# Patient Record
Sex: Male | Born: 1990
Health system: Southern US, Community
[De-identification: ages and names within clinical notes are randomized; demographics above are authoritative.]

## PROBLEM LIST (undated history)

## (undated) DIAGNOSIS — R600 Localized edema: Secondary | ICD-10-CM

## (undated) DIAGNOSIS — F419 Anxiety disorder, unspecified: Secondary | ICD-10-CM

## (undated) DIAGNOSIS — G4733 Obstructive sleep apnea (adult) (pediatric): Secondary | ICD-10-CM

## (undated) DIAGNOSIS — K219 Gastro-esophageal reflux disease without esophagitis: Secondary | ICD-10-CM

## (undated) DIAGNOSIS — R519 Headache, unspecified: Secondary | ICD-10-CM

## (undated) DIAGNOSIS — D649 Anemia, unspecified: Secondary | ICD-10-CM

## (undated) DIAGNOSIS — M9250 Juvenile osteochondrosis of tibia and fibula, unspecified leg: Secondary | ICD-10-CM

## (undated) DIAGNOSIS — Z8709 Personal history of other diseases of the respiratory system: Secondary | ICD-10-CM

## (undated) DIAGNOSIS — F329 Major depressive disorder, single episode, unspecified: Secondary | ICD-10-CM

## (undated) DIAGNOSIS — F32A Depression, unspecified: Secondary | ICD-10-CM

## (undated) DIAGNOSIS — Z872 Personal history of diseases of the skin and subcutaneous tissue: Secondary | ICD-10-CM

## (undated) DIAGNOSIS — IMO0001 Reserved for inherently not codable concepts without codable children: Secondary | ICD-10-CM

## (undated) DIAGNOSIS — E119 Type 2 diabetes mellitus without complications: Secondary | ICD-10-CM

## (undated) DIAGNOSIS — R51 Headache: Secondary | ICD-10-CM

## (undated) DIAGNOSIS — M92519 Juvenile osteochondrosis of proximal tibia, unspecified leg: Secondary | ICD-10-CM

## (undated) HISTORY — PX: KNEE SURGERY: SHX244

---
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
## 2013-02-09 ENCOUNTER — Inpatient Hospital Stay (HOSPITAL_COMMUNITY): Payer: Medicaid Other

## 2013-02-09 ENCOUNTER — Inpatient Hospital Stay (HOSPITAL_COMMUNITY)
Admission: EM | Admit: 2013-02-09 | Discharge: 2013-02-15 | DRG: 871 | Disposition: A | Discharge status: Home / Self Care | Payer: Medicaid Other | Attending: Internal Medicine | Admitting: Internal Medicine

## 2013-02-09 ENCOUNTER — Encounter (HOSPITAL_COMMUNITY): Payer: Self-pay

## 2013-02-09 ENCOUNTER — Emergency Department (HOSPITAL_COMMUNITY): Payer: Medicaid Other

## 2013-02-09 DIAGNOSIS — R51 Headache: Secondary | ICD-10-CM

## 2013-02-09 DIAGNOSIS — R509 Fever, unspecified: Secondary | ICD-10-CM

## 2013-02-09 DIAGNOSIS — J9600 Acute respiratory failure, unspecified whether with hypoxia or hypercapnia: Secondary | ICD-10-CM

## 2013-02-09 DIAGNOSIS — F1210 Cannabis abuse, uncomplicated: Secondary | ICD-10-CM | POA: Diagnosis present

## 2013-02-09 DIAGNOSIS — M79604 Pain in right leg: Secondary | ICD-10-CM

## 2013-02-09 DIAGNOSIS — Z91199 Patient's noncompliance with other medical treatment and regimen due to unspecified reason: Secondary | ICD-10-CM

## 2013-02-09 DIAGNOSIS — A419 Sepsis, unspecified organism: Principal | ICD-10-CM

## 2013-02-09 DIAGNOSIS — L03119 Cellulitis of unspecified part of limb: Secondary | ICD-10-CM | POA: Diagnosis present

## 2013-02-09 DIAGNOSIS — E872 Acidosis, unspecified: Secondary | ICD-10-CM | POA: Diagnosis present

## 2013-02-09 DIAGNOSIS — I498 Other specified cardiac arrhythmias: Secondary | ICD-10-CM | POA: Diagnosis present

## 2013-02-09 DIAGNOSIS — G4733 Obstructive sleep apnea (adult) (pediatric): Secondary | ICD-10-CM | POA: Diagnosis present

## 2013-02-09 DIAGNOSIS — L03116 Cellulitis of left lower limb: Secondary | ICD-10-CM

## 2013-02-09 DIAGNOSIS — M9250 Juvenile osteochondrosis of tibia and fibula, unspecified leg: Secondary | ICD-10-CM

## 2013-02-09 DIAGNOSIS — M928 Other specified juvenile osteochondrosis: Secondary | ICD-10-CM

## 2013-02-09 DIAGNOSIS — L02419 Cutaneous abscess of limb, unspecified: Secondary | ICD-10-CM | POA: Diagnosis present

## 2013-02-09 DIAGNOSIS — R0602 Shortness of breath: Secondary | ICD-10-CM

## 2013-02-09 DIAGNOSIS — Z9119 Patient's noncompliance with other medical treatment and regimen: Secondary | ICD-10-CM

## 2013-02-09 DIAGNOSIS — I872 Venous insufficiency (chronic) (peripheral): Secondary | ICD-10-CM | POA: Diagnosis present

## 2013-02-09 DIAGNOSIS — M92519 Juvenile osteochondrosis of proximal tibia, unspecified leg: Secondary | ICD-10-CM | POA: Diagnosis present

## 2013-02-09 DIAGNOSIS — R6520 Severe sepsis without septic shock: Secondary | ICD-10-CM | POA: Diagnosis present

## 2013-02-09 DIAGNOSIS — E662 Morbid (severe) obesity with alveolar hypoventilation: Secondary | ICD-10-CM

## 2013-02-09 DIAGNOSIS — R519 Headache, unspecified: Secondary | ICD-10-CM

## 2013-02-09 DIAGNOSIS — D72829 Elevated white blood cell count, unspecified: Secondary | ICD-10-CM

## 2013-02-09 DIAGNOSIS — R7309 Other abnormal glucose: Secondary | ICD-10-CM | POA: Diagnosis present

## 2013-02-09 DIAGNOSIS — R1032 Left lower quadrant pain: Secondary | ICD-10-CM

## 2013-02-09 HISTORY — DX: Obstructive sleep apnea (adult) (pediatric): G47.33

## 2013-02-09 HISTORY — DX: Juvenile osteochondrosis of proximal tibia, unspecified leg: M92.519

## 2013-02-09 HISTORY — DX: Juvenile osteochondrosis of tibia and fibula, unspecified leg: M92.50

## 2013-02-09 LAB — COMPREHENSIVE METABOLIC PANEL
ALT: 29 U/L (ref 0–53)
AST: 27 U/L (ref 0–37)
Albumin: 3.5 g/dL (ref 3.5–5.2)
Alkaline Phosphatase: 61 U/L (ref 39–117)
BUN: 7 mg/dL (ref 6–23)
CO2: 26 mEq/L (ref 19–32)
Calcium: 8.7 mg/dL (ref 8.4–10.5)
Chloride: 101 mEq/L (ref 96–112)
Creatinine, Ser: 0.77 mg/dL (ref 0.50–1.35)
GFR calc Af Amer: >90 mL/min (ref >90)
GFR calc non Af Amer: >90 mL/min (ref >90)
Glucose, Bld: 148 mg/dL — ABNORMAL HIGH (ref 70–99)
Potassium: 4.2 mEq/L (ref 3.5–5.1)
Sodium: 137 mEq/L (ref 135–145)
Total Bilirubin: 0.4 mg/dL (ref 0.3–1.2)
Total Protein: 7.8 g/dL (ref 6.0–8.3)

## 2013-02-09 LAB — PRO B NATRIURETIC PEPTIDE: Pro B Natriuretic peptide (BNP): 24.9 pg/mL (ref 0–125)

## 2013-02-09 LAB — POCT I-STAT 3, ART BLOOD GAS (G3+)
Acid-base deficit: 2 mmol/L (ref 0.0–2.0)
Bicarbonate: 22.7 mEq/L (ref 20.0–24.0)
O2 Saturation: 94 %
Patient temperature: 103
TCO2: 24 mmol/L (ref 0–100)
pCO2 arterial: 44 mmHg (ref 35.0–45.0)
pH, Arterial: 7.331 — ABNORMAL LOW (ref 7.350–7.450)
pO2, Arterial: 86 mmHg (ref 80.0–100.0)

## 2013-02-09 LAB — POCT I-STAT TROPONIN I: Troponin i, poc: 0.04 ng/mL (ref 0.00–0.08)

## 2013-02-09 LAB — CBC WITH DIFFERENTIAL/PLATELET
Basophils Absolute: 0 10*3/uL (ref 0.0–0.1)
Basophils Relative: 0 % (ref 0–1)
Eosinophils Absolute: 0 10*3/uL (ref 0.0–0.7)
Eosinophils Relative: 0 % (ref 0–5)
HCT: 46.1 % (ref 39.0–52.0)
Hemoglobin: 15.5 g/dL (ref 13.0–17.0)
Lymphocytes Relative: 6 % — ABNORMAL LOW (ref 12–46)
Lymphs Abs: 0.8 10*3/uL (ref 0.7–4.0)
MCH: 26.4 pg (ref 26.0–34.0)
MCHC: 33.6 g/dL (ref 30.0–36.0)
MCV: 78.5 fL (ref 78.0–100.0)
Monocytes Absolute: 1 10*3/uL (ref 0.1–1.0)
Monocytes Relative: 7 % (ref 3–12)
Neutro Abs: 12.3 10*3/uL — ABNORMAL HIGH (ref 1.7–7.7)
Neutrophils Relative %: 87 % — ABNORMAL HIGH (ref 43–77)
Platelets: 181 10*3/uL (ref 150–400)
RBC: 5.87 MIL/uL — ABNORMAL HIGH (ref 4.22–5.81)
RDW: 15.1 % (ref 11.5–15.5)
WBC: 14.2 10*3/uL — ABNORMAL HIGH (ref 4.0–10.5)

## 2013-02-09 LAB — URINALYSIS, ROUTINE W REFLEX MICROSCOPIC
Bilirubin Urine: NEGATIVE
Glucose, UA: NEGATIVE mg/dL
Hgb urine dipstick: NEGATIVE
Ketones, ur: NEGATIVE mg/dL
Leukocytes, UA: NEGATIVE
Nitrite: NEGATIVE
Protein, ur: NEGATIVE mg/dL
Specific Gravity, Urine: 1.014 (ref 1.005–1.030)
Urobilinogen, UA: 1 mg/dL (ref 0.0–1.0)
pH: 7 (ref 5.0–8.0)

## 2013-02-09 LAB — LIPASE, BLOOD: Lipase: 13 U/L (ref 11–59)

## 2013-02-09 LAB — CG4 I-STAT (LACTIC ACID)
Lactic Acid, Venous: 4.6 mmol/L — ABNORMAL HIGH (ref 0.5–2.2)
Lactic Acid, Venous: 2.62 mmol/L — ABNORMAL HIGH (ref 0.5–2.2)

## 2013-02-09 LAB — PROCALCITONIN: Procalcitonin: 0.64 ng/mL

## 2013-02-09 MED ORDER — IOHEXOL 350 MG/ML SOLN
100.0000 mL | Freq: Once | INTRAVENOUS | Status: AC | PRN
  Administered 2013-02-09: 100 mL via INTRAVENOUS

## 2013-02-09 MED ORDER — SODIUM CHLORIDE 0.9 % IV BOLUS (SEPSIS)
1000.0000 mL | Freq: Once | INTRAVENOUS | Status: AC
  Administered 2013-02-09: 1000 mL via INTRAVENOUS

## 2013-02-09 MED ORDER — ENOXAPARIN SODIUM 120 MG/0.8ML ~~LOC~~ SOLN
110.0000 mg | SUBCUTANEOUS | Status: DC
  Administered 2013-02-10: 110 mg via SUBCUTANEOUS
  Administered 2013-02-10: via SUBCUTANEOUS
  Administered 2013-02-11 – 2013-02-12 (×2): 110 mg via SUBCUTANEOUS
  Filled 2013-02-09 (×5): qty 0.8

## 2013-02-09 MED ORDER — SODIUM CHLORIDE 0.9 % IV SOLN
1000.0000 mL | INTRAVENOUS | Status: DC
  Administered 2013-02-09: 1000 mL via INTRAVENOUS

## 2013-02-09 MED ORDER — DEXTROSE 5 % IV SOLN: 500.0000 mg | INTRAVENOUS | Status: DC

## 2013-02-09 MED ORDER — ACETAMINOPHEN 325 MG PO TABS
325.0000 mg | ORAL_TABLET | ORAL | Status: AC
  Administered 2013-02-09: 325 mg via ORAL
  Filled 2013-02-09: qty 1

## 2013-02-09 MED ORDER — ONDANSETRON HCL 4 MG PO TABS: 4.0000 mg | ORAL_TABLET | Freq: Three times a day (TID) | ORAL | Status: DC | PRN

## 2013-02-09 MED ORDER — ONDANSETRON 4 MG PO TBDP
4.0000 mg | ORAL_TABLET | Freq: Once | ORAL | Status: AC
  Administered 2013-02-09: 4 mg via ORAL
  Filled 2013-02-09: qty 1

## 2013-02-09 MED ORDER — SODIUM CHLORIDE 0.9 % IV SOLN
INTRAVENOUS | Status: AC
  Administered 2013-02-09: 20:00:00 via INTRAVENOUS

## 2013-02-09 MED ORDER — ONDANSETRON HCL 4 MG/2ML IJ SOLN
4.0000 mg | Freq: Four times a day (QID) | INTRAMUSCULAR | Status: DC | PRN
  Administered 2013-02-10: 4 mg via INTRAVENOUS
  Filled 2013-02-09: qty 2

## 2013-02-09 MED ORDER — SODIUM CHLORIDE 0.9 % IV SOLN
1000.0000 mL | Freq: Once | INTRAVENOUS | Status: AC
  Administered 2013-02-09: 1000 mL via INTRAVENOUS

## 2013-02-09 MED ORDER — HYDROCODONE-ACETAMINOPHEN 5-325 MG PO TABS
1.0000 | ORAL_TABLET | ORAL | Status: DC | PRN
  Administered 2013-02-10 (×2): 1 via ORAL
  Administered 2013-02-11: 2 via ORAL
  Administered 2013-02-11 (×2): 1 via ORAL
  Administered 2013-02-12 – 2013-02-13 (×4): 2 via ORAL
  Filled 2013-02-09: qty 2
  Filled 2013-02-09 (×2): qty 1
  Filled 2013-02-09: qty 2
  Filled 2013-02-09 (×2): qty 1
  Filled 2013-02-09: qty 2
  Filled 2013-02-09: qty 1
  Filled 2013-02-09: qty 2
  Filled 2013-02-09: qty 1

## 2013-02-09 MED ORDER — DEXTROSE 5 % IV SOLN
2.0000 g | INTRAVENOUS | Status: DC
  Filled 2013-02-09: qty 2

## 2013-02-09 MED ORDER — DEXTROSE 5 % IV SOLN
1.0000 g | Freq: Once | INTRAVENOUS | Status: AC
  Administered 2013-02-09: 1 g via INTRAVENOUS
  Filled 2013-02-09: qty 10

## 2013-02-09 MED ORDER — DOXYCYCLINE HYCLATE 100 MG IV SOLR
100.0000 mg | Freq: Two times a day (BID) | INTRAVENOUS | Status: DC
  Administered 2013-02-09 – 2013-02-13 (×8): 100 mg via INTRAVENOUS
  Filled 2013-02-09 (×9): qty 100

## 2013-02-09 MED ORDER — ACETAMINOPHEN 325 MG PO TABS
650.0000 mg | ORAL_TABLET | Freq: Four times a day (QID) | ORAL | Status: DC | PRN
  Administered 2013-02-09 – 2013-02-11 (×3): 650 mg via ORAL
  Filled 2013-02-09 (×3): qty 2

## 2013-02-09 MED ORDER — ENOXAPARIN SODIUM 40 MG/0.4ML ~~LOC~~ SOLN: 40.0000 mg | SUBCUTANEOUS | Status: DC

## 2013-02-09 MED ORDER — ONDANSETRON HCL 4 MG PO TABS: 4.0000 mg | ORAL_TABLET | Freq: Four times a day (QID) | ORAL | Status: DC | PRN

## 2013-02-09 MED ORDER — SODIUM CHLORIDE 0.9 % IV SOLN
INTRAVENOUS | Status: DC
  Administered 2013-02-10 (×2): via INTRAVENOUS

## 2013-02-09 MED ORDER — DEXTROSE 5 % IV SOLN
500.0000 mg | Freq: Once | INTRAVENOUS | Status: AC
  Administered 2013-02-09: 500 mg via INTRAVENOUS
  Filled 2013-02-09: qty 500

## 2013-02-09 NOTE — Progress Notes (Signed)
ANTIBIOTIC CONSULT NOTE - INITIAL  Pharmacy Consult for rocephin/azithromycin Indication: Sepsis  No Known Allergies  Patient Measurements: Height: 5\' 9"  (175.3 cm) IBW/kg (Calculated) : 70.7   Vital Signs: Temp: 103 F (39.4 C) (07/03 1443) Temp src: Oral (07/03 1443) BP: 134/77 mmHg (07/03 1545) Pulse Rate: 122 (07/03 1545) Intake/Output from previous day:   Intake/Output from this shift:    Labs: No results found for this basename: WBC, HGB, PLT, LABCREA, CREATININE,  in the last 72 hours CrCl is unknown because no creatinine reading has been taken. No results found for this basename: VANCOTROUGH, VANCOPEAK, VANCORANDOM, GENTTROUGH, GENTPEAK, GENTRANDOM, TOBRATROUGH, TOBRAPEAK, TOBRARND, AMIKACINPEAK, AMIKACINTROU, AMIKACIN,  in the last 72 hours   Microbiology: No results found for this or any previous visit (from the past 720 hour(s)).  Medical History: Past Medical History  Diagnosis Date  . Blount's disease     Medications:  No medications PTA  Assessment: 22 yo male here with SOB/dizziness and Code Sepsis called.  Pharmacy has been asked to dose rocephin and azithromycin. Initial doses for ED administration have been ordered.  02/09/13 rocephin 02/09/13 azithromycin   7/3 blood x2 7/3 urine  Plan:  -Rocephin 2gm IV q24hr and azithromycin 500mg  IV q24hr -Will follow cultures and clinical progress  Harland German, Pharm D 02/09/2013 4:06 PM

## 2013-02-09 NOTE — ED Notes (Signed)
Phlebotomy at bedside.

## 2013-02-09 NOTE — Consult Note (Signed)
PULMONARY  / CRITICAL CARE MEDICINE  Name: Anthony Cox MRN: [id number] DOB: Sep 03, 1990    ADMISSION DATE:  02/09/2013 CONSULTATION DATE:  02/09/2013  REFERRING MD :  Onalee Hua PRIMARY SERVICE: PCCM  CHIEF COMPLAINT:  Leg pain, headache pain  BRIEF PATIENT DESCRIPTION: 22 y/o male with morbid obesity presented to the [facility] ED on 7/3 with fever, headache, and left thigh pain.  PCCM consulted for severe sepsis and increased WOB.  SIGNIFICANT EVENTS / STUDIES:  7/3 CT chest angio>> poor study, no clear PE, motion artifact  LINES / TUBES:   CULTURES: 7/3 blood >> 7/3 urine >> 7/3 RMSF Ab >>  ANTIBIOTICS: 7/3 ceftriaxone >> 7/3 doxycycline >>  HISTORY OF PRESENT ILLNESS:  22 y/o male with morbid obesity presented to the [facility] ED on 7/3 with fever, headache, and left thigh pain.  PCCM consulted for severe sepsis and increased WOB. He noted the pain in his R thigh 1-2 days prior to admission and then developed the fever on 7/3 so they came to the ED.  He notes no recent trauma or fall, and he wonders if the thigh pain is related to swimming at the gym several days ago.  He went fishing 4-5 days prior to admission but did not note any tick bites.  Headache has developed today.  He denies rash or cough.  Despite an increased respiratory rate, he does not feel short of breath.  He says that he is breathing quickly because his head hurts.  He is supposed to use CPAP at home but does not frequently use it.  PAST MEDICAL HISTORY :  Past Medical History  Diagnosis Date  . Blount's disease    Past Surgical History  Procedure Laterality Date  . Knee surgery     Prior to Admission medications   Medication Sig Start Date End Date Taking? Authorizing Provider  ondansetron (ZOFRAN) 4 MG tablet Take 1 tablet (4 mg total) by mouth every 8 (eight) hours as needed for nausea. 02/09/13   Dione Booze, MD   No Known Allergies  FAMILY HISTORY:  No family history on file. SOCIAL HISTORY:  reports that he  has never smoked. He does not have any smokeless tobacco history on file. He reports that  drinks alcohol. He reports that he uses illicit drugs (Marijuana).  REVIEW OF SYSTEMS:   Gen: + fever, chills, weight change, fatigue, night sweats HEENT: Denies blurred vision, double vision, hearing loss, tinnitus, sinus congestion, rhinorrhea, sore throat, neck stiffness, dysphagia PULM: Denies shortness of breath, cough, sputum production, hemoptysis, wheezing CV: Denies chest pain, edema, orthopnea, paroxysmal nocturnal dyspnea, palpitations GI: Denies abdominal pain, nausea, vomiting, diarrhea, hematochezia, melena, constipation, change in bowel habits GU: Denies dysuria, hematuria, polyuria, oliguria, urethral discharge Endocrine: Denies hot or cold intolerance, polyuria, polyphagia or appetite change Derm: Denies rash, dry skin, scaling or peeling skin change Heme: Denies easy bruising, bleeding, bleeding gums Neuro: Denies headache, numbness, weakness, slurred speech, loss of memory or consciousness    SUBJECTIVE:   VITAL SIGNS: Temp:  [102.8 F (39.3 C)-104 F (40 C)] 103.8 F (39.9 C) (07/03 1924) Pulse Rate:  [119-136] 120 (07/03 2015) Resp:  [26-50] 50 (07/03 2015) BP: (116-152)/(37-86) 130/37 mmHg (07/03 2015) SpO2:  [94 %-100 %] 94 % (07/03 2015) Weight:  [226.799 kg (500 lb)] 226.799 kg (500 lb) (07/03 1608) HEMODYNAMICS:   VENTILATOR SETTINGS:   INTAKE / OUTPUT: Intake/Output     07/03 0701 - 07/04 0700   Urine (mL/kg/hr) 525   Total  Output 525   Net -525         PHYSICAL EXAMINATION:  Gen: morbidly obese, tachypnic but speaking in full sentences, no paradoxical breathing HEENT: NCAT, PERRL, EOMi, OP clear, large neck PULM: CTA B CV: RRR, no mgr, no JVD AB: BS+, soft, nontender, no hsm Ext: warm, trace edema, no clubbing, no cyanosis Derm: no rash or skin breakdown; inner R thigh is tender, but not warm, fluctuant, or indurated Neuro: A&Ox4, CN II-XII intact,  strength 5/5 in all 4 extremities   LABS:  Recent Labs Lab 02/09/13 1449 02/09/13 1620 02/09/13 1635 02/09/13 1636 02/09/13 1719 02/09/13 1924  HGB 15.5  --   --   --   --   --   WBC 14.2*  --   --   --   --   --   PLT 181  --   --   --   --   --   NA  --   --   --  137  --   --   K  --   --   --  4.2  --   --   CL  --   --   --  101  --   --   CO2  --   --   --  26  --   --   GLUCOSE  --   --   --  148*  --   --   BUN  --   --   --  7  --   --   CREATININE  --   --   --  0.77  --   --   CALCIUM  --   --   --  8.7  --   --   AST  --   --   --  27  --   --   ALT  --   --   --  29  --   --   ALKPHOS  --   --   --  61  --   --   BILITOT  --   --   --  0.4  --   --   PROT  --   --   --  7.8  --   --   ALBUMIN  --   --   --  3.5  --   --   LATICACIDVEN  --  4.60*  --   --   --  2.62*  PROCALCITON  --   --   --  0.64  --   --   PROBNP  --   --  24.9  --   --   --   PHART  --   --   --   --  7.331*  --   PCO2ART  --   --   --   --  44.0  --   PO2ART  --   --   --   --  86.0  --    No results found for this basename: GLUCAP,  in the last 168 hours  CXR: no clear infiltrate EKG: Sinus tach, no ST wave changes  ASSESSMENT / PLAN:  22 y/o male with morbid obesity, sinus tachycardia, tachypnea fever, R thigh pain and headache of 1-2 days duration.  Overall picture worrisome for sepsis but lactic acid is improving with IVF.  DDx here is broad and no clear unifying diagnosis exists right now.  Viral prodrome  most likely scenario, but would consider DVT given thigh pain, but also cellulitis; consider tick borne illness vs meningitis given fever, headache and recent fishing trip.    PULMONARY A: Obesity hypoventilation syndrome Tachypnea likely due to pain; reassured by normal ABG P:   -BIPAP QHS -repeat ABG -O2 as needed  CARDIOVASCULAR A: Sinus tach due to fever, sepsis;  Lactic acidosis improving P:  -continue IVF as you are doing -tele  RENAL A:  No acute issues P:    -monitor UOP, BMET  GASTROINTESTINAL A:  No acute issues P:     HEMATOLOGIC A:  No acute issues P:    INFECTIOUS A:  Sepsis with broad ddx as outlined above P:   -agree with ceftriaxone and doxycycline -would consult ID if not improved by 7/4 AM -consider IR guided LP -check RMSF serology -would ultrasound R thigh  ENDOCRINE A:  No acute issues P:     NEUROLOGIC A:  No acute issues P:   -APAP prn for headache  TODAY'S SUMMARY: 22 y/o male with sepsis of uncertain etiology improving with IVF; broad ddx.  Tachypnic but comfortable.  Plan BIPAP, repeat ABG, infectious work up as identified above.   Fonnie Jarvis Pulmonary and Critical Care Medicine [facility] Pager: [phone]  02/09/2013, 8:49 PM

## 2013-02-09 NOTE — ED Notes (Signed)
PER EMS: pt was at home laying on bed when he got up to walk into bathroom, got a HA and dizzy, SOB, then went to lay back down but had to throw up, vomited x 3. Complains of left groin/leg pain. Pt states he usually is winded but this time is different. EMS arrival: Sinus tach, 94% RA, put 2L [place] and O2 increased to 97%. BP: 145/80, HR-130.

## 2013-02-09 NOTE — Progress Notes (Signed)
Arms scanned using Prevue to locate vein to access for PIV - unable to locate vein either lower arm adequate for angio so left cephalic vein accessed using [place] to locate and place 20g 1.88" angio - labs drawn from new site - flushed x3 with good blood return noted each time - IV fluids started via site.

## 2013-02-09 NOTE — ED Notes (Signed)
Second IV team member at bedside attempting second IV.

## 2013-02-09 NOTE — H&P (Signed)
PCP:   Johny Blamer, MD   Chief Complaint:  Fever and neck pain  HPI: 22 yo male with h/o osa noncompliant with cpap at home, blounts disease with no recent surgeries, morbid obesity who comes in with high fever that started today with chills.  He is otherwise healthy.  Denies cough, no n/v/diarrhea.  No abd pain.  No cp.  Is very sob more than usual.  Denies any rashes or dysuria.  No le edema or swelling.  He is having left inner thigh/groin pain that is new.  No rash present.  Denies any scrotal swelling or penile discharge.  No recent abx.  He did have a uri several weeks ago.  No recent traveling.  He is also complaining of a headache since yesterday.  Review of Systems:  Positive and negative as per HPI otherwise all other systems are negative  Past Medical History: Past Medical History  Diagnosis Date  . Blount's disease    Past Surgical History  Procedure Laterality Date  . Knee surgery      Medications: Prior to Admission medications   Medication Sig Start Date End Date Taking? Authorizing Provider  ondansetron (ZOFRAN) 4 MG tablet Take 1 tablet (4 mg total) by mouth every 8 (eight) hours as needed for nausea. 02/09/13   Dione Booze, MD    Allergies:  No Known Allergies  Social History:  reports that he has never smoked. He does not have any smokeless tobacco history on file. He reports that  drinks alcohol. He reports that he uses illicit drugs (Marijuana).  Denies any ivda.  Family History: negative  Physical Exam: Filed Vitals:   02/09/13 1915 02/09/13 1924 02/09/13 1930 02/09/13 1945  BP: 148/60  123/86 139/51  Pulse: 123 119 119 120  Temp:  103.8 F (39.9 C)    TempSrc:  Rectal    Resp: 34 30    Height:      Weight:      SpO2: 99% 98% 99% 99%   General appearance: alert, cooperative and moderate distress  Cannot speak in full sentences Head: Normocephalic, without obvious abnormality, atraumatic Eyes: negative Nose: Nares normal. Septum midline.  Mucosa normal. No drainage or sinus tenderness. Neck: no JVD and supple, symmetrical, trachea midline Lungs: clear to auscultation bilaterally Heart: regular rate and rhythm Abdomen: soft, non-tender; bowel sounds normal; no masses,  no organomegaly Male genitalia: normal Extremities: extremities normal, atraumatic, no cyanosis or edema Pulses: 2+ and symmetric Skin: Skin color, texture, turgor normal. No rashes or lesions  Left inner thigh painful to touch, no warmth no rash palpation normal Neurologic: Grossly normal    Labs on Admission:   Recent Labs  02/09/13 1636  NA 137  K 4.2  CL 101  CO2 26  GLUCOSE 148*  BUN 7  CREATININE 0.77  CALCIUM 8.7    Recent Labs  02/09/13 1636  AST 27  ALT 29  ALKPHOS 61  BILITOT 0.4  PROT 7.8  ALBUMIN 3.5    Recent Labs  02/09/13 1636  LIPASE 13    Recent Labs  02/09/13 1449  WBC 14.2*  NEUTROABS 12.3*  HGB 15.5  HCT 46.1  MCV 78.5  PLT 181    Radiological Exams on Admission: Ct Angio Chest Pe W/cm &/or Wo Cm  02/09/2013   *RADIOLOGY REPORT*  Clinical Data: Shortness of breath.  CT ANGIOGRAPHY CHEST  Technique:  Multidetector CT imaging of the chest using the standard protocol during bolus administration of intravenous contrast. Multiplanar  reconstructed images including MIPs were obtained and reviewed to evaluate the vascular anatomy.  Contrast: OMNIPAQUE IOHEXOL 350 MG/ML SOLN  Comparison: Single view of the chest 02/09/2013 at to 1517 hours.  Findings: The study is limited by the patient's size.  There is also respiratory motion.  No obvious pulmonary embolus is identified.  Heart size is normal.  No pleural or pericardial effusion.  No axillary, hilar or mediastinal lymphadenopathy.  The lungs are grossly clear.  The visualized upper abdomen is unremarkable.  No focal bony abnormality is identified.  IMPRESSION: Limited study demonstrating no evidence of pulmonary embolus or acute cardiopulmonary disease.    Original Report Authenticated By: Holley Dexter, M.D.   Dg Chest Port 1 View  02/09/2013   *RADIOLOGY REPORT*  Clinical Data: Headache.  Short of breath.  PORTABLE CHEST - 1 VIEW  Comparison: None.  Findings: Artifact overlies chest.  Heart size is normal. Mediastinal shadows are normal.  Lungs are clear.  No effusions. No bony abnormalities.  IMPRESSION: No active disease   Original Report Authenticated By: Paulina Fusi, M.D.    Assessment/Plan 22 yo male with sepsis of unclear etiology who has underlying osa and morbid obesity  Principal Problem:   Sepsis Active Problems:   Acute respiratory failure   SOB (shortness of breath)   Blount's disease  Have spoken to CCM for further input.  i wonder if he may have a cellulitis to his inner left thigh that may be brewing, will need to recheck this area again tomorrow.  Place on rocephin, and doxy.  Dr Kendrick Fries got further history that he went fishing in the last 3-4 days, ????tick bite.  Ck serologies and cover with doxy.  Will also obtain lle u/s to r/o dvt or absess.  Will ck total cpk levels also for myositis.  Place on bipap in the stepdown unit.  cta is neg PE but of poor quality.  Consider LP in am which will need to be done with IR.  Blood cultures have been obtained.  Full code.  Moyses Pavey A 02/09/2013, 8:12 PM

## 2013-02-09 NOTE — Progress Notes (Signed)
ANTICOAGULATION CONSULT NOTE - Initial Consult  Pharmacy Consult for Lovenox Indication: VTE prophylaxis in morbid obesity  No Known Allergies  Patient Measurements: Height: 5\' 9"  (175.3 cm) Weight: 500 lb (226.799 kg) IBW/kg (Calculated) : 70.7 Heparin Dosing Weight:   Vital Signs: Temp: 103.1 F (39.5 C) (07/03 2153) Temp src: Oral (07/03 2153) BP: 125/54 mmHg (07/03 2030) Pulse Rate: 117 (07/03 2224)  Labs:  Recent Labs  02/09/13 1449 02/09/13 1636  HGB 15.5  --   HCT 46.1  --   PLT 181  --   CREATININE  --  0.77    Estimated Creatinine Clearance: 279.6 ml/min (by C-G formula based on Cr of 0.77).   Medical History: Past Medical History  Diagnosis Date  . Blount's disease     Medications:  No prescriptions prior to admission    Assessment: Left thigh pain, headache, fever, increased respiratory rate. Ddx: sepsis, tick borne illness vs meningitis, cellulitis.  Pharmacy may adjust Lovenox dosing (per comments on the 40mg  dose) which requires significant adjustment in the patient with morbid obesity.   Scr 0.77. Hgb 15.5 and platelets 181.   Goal of Therapy:  DVT prophylaxis Monitor platelets by anticoagulation protocol: Yes   Plan:  Lovenox 110mg  SQ once daily (0.5mg /kg/day) for BMI>30. Likely will require LMWH levels at steady state.  Merilynn Finland, [employer] 02/09/2013,10:32 PM

## 2013-02-09 NOTE — ED Provider Notes (Signed)
22 year old male was brought in because his breathing seemed to be heavier than normal and he was having chills at home. There's been no cough and no dysuria. He was noted to be febrile on arrival here are temperature 100.3 heart rate of 125 and respiratory rate of 40. He is morbidly obese. Lungs are clear heart has regular rate and rhythm. Abdomen is soft and nontender. Workup in emergency Department is negative for signs of infection with clean urine and a normal chest x-ray and normal CT of his chest. Lactic acid was initially elevated to 4.6 and is compared to 2.6 with IV hydration. Procalcitonin level is in an indeterminate range. He has received antibiotics for sepsis of uncertain cause and will need to be admitted.   Date: 02/09/2013  Rate: 122  Rhythm: sinus tachycardia  QRS Axis: normal  Intervals: normal  ST/T Wave abnormalities: normal  Conduction Disutrbances:none  Narrative Interpretation: Sinus tachycardia, otherwise normal ECG. No prior ECG available for comparison.  Old EKG Reviewed: none available  CRITICAL CARE Performed by: QIONG,EXBMW Total critical care time: 50 minutes Critical care time was exclusive of separately billable procedures and treating other patients. Critical care was necessary to treat or prevent imminent or life-threatening deterioration. Critical care was time spent personally by me on the following activities: development of treatment plan with patient and/or surrogate as well as nursing, discussions with consultants, evaluation of patient's response to treatment, examination of patient, obtaining history from patient or surrogate, ordering and performing treatments and interventions, ordering and review of laboratory studies, ordering and review of radiographic studies, pulse oximetry and re-evaluation of patient's condition.   I saw and evaluated the patient, reviewed the resident's note and I agree with the findings and plan.   Dione Booze,  MD 02/09/13 [phone]

## 2013-02-09 NOTE — ED Notes (Signed)
IV team attempting IV start with ultrasound

## 2013-02-09 NOTE — ED Provider Notes (Signed)
History    CSN: [id number] Arrival date & time 02/09/13  1436  First MD Initiated Contact with Patient 02/09/13 1511     Chief Complaint  Patient presents with  . 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 States normal state of health this am. Stood up to walk to bathroom, developed dizziness, dyspnea, mild HA, n/v x 3 and pain in left groin. Denies cough, no hx dvt/pe, no recent surgery or travel, + sick contact - friend w/ viral illness. EMS noted pt to be febrile, tachycardic and tachypneic. Currently denies hip pain. No injury. No hx of asthma.   Patient is a 22 y.o. male presenting with shortness of breath. The history is provided by the patient. No language interpreter was used.  Shortness of Breath Severity:  Severe Onset quality:  Sudden Timing:  Constant Progression:  Worsening Chronicity:  New Associated symptoms: fever   Associated symptoms: no abdominal pain, no chest pain, no cough, no headaches, no rash, no sore throat and no vomiting    Past Medical History  Diagnosis Date  . Blount's disease    Past Surgical History  Procedure Laterality Date  . Knee surgery     No family history on file. History  Substance Use Topics  . Smoking status: Never Smoker   . Smokeless tobacco: Not on file  . Alcohol Use: Yes    Review of Systems  Constitutional: Positive for fever and chills.  HENT: Negative for congestion and sore throat.   Respiratory: Positive for shortness of breath. Negative for cough.   Cardiovascular: Negative for chest pain and leg swelling.  Gastrointestinal: Negative for nausea, vomiting, abdominal pain, diarrhea and constipation.  Genitourinary: Negative for dysuria and frequency.  Musculoskeletal: Positive for arthralgias.  Skin: Negative for color change and rash.  Neurological: Negative for dizziness  and headaches.  Psychiatric/Behavioral: Negative for confusion and agitation.  All other systems reviewed and are negative.    Allergies  Review of patient's allergies indicates no known allergies.  Home Medications   Current Outpatient Rx  Name  Route  Sig  Dispense  Refill  . ondansetron (ZOFRAN) 4 MG tablet   Oral   Take 1 tablet (4 mg total) by mouth every 8 (eight) hours as needed for nausea.   20 tablet   0    BP 148/71  Pulse 125  Temp(Src) 103 F (39.4 C) (Oral)  Ht 5\' 9"  (1.753 m)  SpO2 98% Physical Exam  Constitutional: He is oriented to person, place, and time. He appears well-developed and well-nourished. No distress.  HENT:  Head: Normocephalic and atraumatic.  Eyes: EOM are normal. Pupils are equal, round, and reactive to light.  Neck: Normal range of motion. Neck supple. No Brudzinski's sign noted.  Cardiovascular: Regular rhythm.  Tachycardia present.   Pulmonary/Chest: Tachypnea noted. He is in respiratory distress. He has decreased breath sounds. He has no wheezes.  Abdominal: Soft. He exhibits distension.  Musculoskeletal: Normal range of motion. He exhibits no edema.  Neurological: He is alert and oriented to person, place, and time. GCS eye subscore is 4. GCS verbal subscore is 5. GCS motor subscore is 6.  Skin: Skin is warm and dry.  Psychiatric: He has a normal mood and affect. His behavior is normal.    ED Course  Procedures (including critical care time) Labs Reviewed  CULTURE, BLOOD (ROUTINE X 2)  CULTURE, BLOOD (ROUTINE X  2)  URINE CULTURE  CBC WITH DIFFERENTIAL  PRO B NATRIURETIC PEPTIDE  URINALYSIS, ROUTINE W REFLEX MICROSCOPIC  COMPREHENSIVE METABOLIC PANEL  PROCALCITONIN   Results for orders placed during the hospital encounter of 02/09/13  MRSA PCR SCREENING      Result Value Range   MRSA by PCR NEGATIVE  NEGATIVE  CBC WITH DIFFERENTIAL      Result Value Range   WBC 14.2 (*) 4.0 - 10.5 K/uL   RBC 5.87 (*) 4.22 - 5.81 MIL/uL    Hemoglobin 15.5  13.0 - 17.0 g/dL   HCT 45.4  09.8 - 11.9 %   MCV 78.5  78.0 - 100.0 fL   MCH 26.4  26.0 - 34.0 pg   MCHC 33.6  30.0 - 36.0 g/dL   RDW 14.7  82.9 - 56.2 %   Platelets 181  150 - 400 K/uL   Neutrophils Relative % 87 (*) 43 - 77 %   Neutro Abs 12.3 (*) 1.7 - 7.7 K/uL   Lymphocytes Relative 6 (*) 12 - 46 %   Lymphs Abs 0.8  0.7 - 4.0 K/uL   Monocytes Relative 7  3 - 12 %   Monocytes Absolute 1.0  0.1 - 1.0 K/uL   Eosinophils Relative 0  0 - 5 %   Eosinophils Absolute 0.0  0.0 - 0.7 K/uL   Basophils Relative 0  0 - 1 %   Basophils Absolute 0.0  0.0 - 0.1 K/uL  PRO B NATRIURETIC PEPTIDE      Result Value Range   Pro B Natriuretic peptide (BNP) 24.9  0 - 125 pg/mL  URINALYSIS, ROUTINE W REFLEX MICROSCOPIC      Result Value Range   Color, Urine YELLOW  YELLOW   APPearance CLEAR  CLEAR   Specific Gravity, Urine 1.014  1.005 - 1.030   pH 7.0  5.0 - 8.0   Glucose, UA NEGATIVE  NEGATIVE mg/dL   Hgb urine dipstick NEGATIVE  NEGATIVE   Bilirubin Urine NEGATIVE  NEGATIVE   Ketones, ur NEGATIVE  NEGATIVE mg/dL   Protein, ur NEGATIVE  NEGATIVE mg/dL   Urobilinogen, UA 1.0  0.0 - 1.0 mg/dL   Nitrite NEGATIVE  NEGATIVE   Leukocytes, UA NEGATIVE  NEGATIVE  COMPREHENSIVE METABOLIC PANEL      Result Value Range   Sodium 137  135 - 145 mEq/L   Potassium 4.2  3.5 - 5.1 mEq/L   Chloride 101  96 - 112 mEq/L   CO2 26  19 - 32 mEq/L   Glucose, Bld 148 (*) 70 - 99 mg/dL   BUN 7  6 - 23 mg/dL   Creatinine, Ser 1.30  0.50 - 1.35 mg/dL   Calcium 8.7  8.4 - 86.5 mg/dL   Total Protein 7.8  6.0 - 8.3 g/dL   Albumin 3.5  3.5 - 5.2 g/dL   AST 27  0 - 37 U/L   ALT 29  0 - 53 U/L   Alkaline Phosphatase 61  39 - 117 U/L   Total Bilirubin 0.4  0.3 - 1.2 mg/dL   GFR calc non Af Amer >90  >90 mL/min   GFR calc Af Amer >90  >90 mL/min  PROCALCITONIN      Result Value Range   Procalcitonin 0.64    LIPASE, BLOOD      Result Value Range   Lipase 13  11 - 59 U/L  CK      Result Value  Range   Total CK  554 (*) 7 - 232 U/L  CG4 I-STAT (LACTIC ACID)      Result Value Range   Lactic Acid, Venous 4.60 (*) 0.5 - 2.2 mmol/L  POCT I-STAT TROPONIN I      Result Value Range   Troponin i, poc 0.04  0.00 - 0.08 ng/mL   Comment 3           POCT I-STAT 3, BLOOD GAS (G3+)      Result Value Range   pH, Arterial 7.331 (*) 7.350 - 7.450   pCO2 arterial 44.0  35.0 - 45.0 mmHg   pO2, Arterial 86.0  80.0 - 100.0 mmHg   Bicarbonate 22.7  20.0 - 24.0 mEq/L   TCO2 24  0 - 100 mmol/L   O2 Saturation 94.0     Acid-base deficit 2.0  0.0 - 2.0 mmol/L   Patient temperature 103.0 F     Collection site RADIAL, ALLEN'S TEST ACCEPTABLE     Drawn by RT     Sample type ARTERIAL    CG4 I-STAT (LACTIC ACID)      Result Value Range   Lactic Acid, Venous 2.62 (*) 0.5 - 2.2 mmol/L        DG Hip Complete Left (Final result)  Result time: 02/09/13 21:24:55    Final result by Rad Results In Interface (02/09/13 21:24:55)    Narrative:   *RADIOLOGY REPORT*  Clinical Data: Left hip pain. Short of breath.  LEFT HIP - COMPLETE 2+ VIEW  Comparison: None.  Findings: Excreted contrast filled urinary bladder. The pelvic rings appear intact. Sacrum partially obscured by contrast in the urinary bladder. There is no hip joint space narrowing. No fracture. The panniculus projects over the hips bilaterally. Left hip is located.  IMPRESSION: No acute abnormality.   Original Report Authenticated By: Andreas Newport, M.D.             CT Angio Chest PE W/Cm &/Or Wo Cm (Final result)  Result time: 02/09/13 18:46:15    Final result by Rad Results In Interface (02/09/13 18:46:15)    Narrative:   *RADIOLOGY REPORT*  Clinical Data: Shortness of breath.  CT ANGIOGRAPHY CHEST  Technique: Multidetector CT imaging of the chest using the standard protocol during bolus administration of intravenous contrast. Multiplanar reconstructed images including MIPs were obtained and reviewed to evaluate the  vascular anatomy.  Contrast: OMNIPAQUE IOHEXOL 350 MG/ML SOLN  Comparison: Single view of the chest 02/09/2013 at to 1517 hours.  Findings: The study is limited by the patient's size. There is also respiratory motion. No obvious pulmonary embolus is identified. Heart size is normal. No pleural or pericardial effusion. No axillary, hilar or mediastinal lymphadenopathy. The lungs are grossly clear. The visualized upper abdomen is unremarkable. No focal bony abnormality is identified.  IMPRESSION: Limited study demonstrating no evidence of pulmonary embolus or acute cardiopulmonary disease.   Original Report Authenticated By: Holley Dexter, M.D.             [facility] Chest [facility] (Final result)  Result time: 02/09/13 15:26:15    Final result by Rad Results In Interface (02/09/13 15:26:15)    Narrative:   *RADIOLOGY REPORT*  Clinical Data: Headache. Short of breath.  PORTABLE CHEST - 1 VIEW  Comparison: None.  Findings: Artifact overlies chest. Heart size is normal. Mediastinal shadows are normal. Lungs are clear. No effusions. No bony abnormalities.  IMPRESSION: No active disease   Original Report Authenticated By: Paulina Fusi, M.D.  Date: 02/10/2013  Rate: 122  Rhythm: sinus tachycardia  QRS Axis: left  Intervals: normal  ST/T Wave abnormalities: normal  Conduction Disutrbances:none  Narrative Interpretation:   Old EKG Reviewed: none available   Dg Chest Port 1 View  02/09/2013   *RADIOLOGY REPORT*  Clinical Data: Headache.  Short of breath.  PORTABLE CHEST - 1 VIEW  Comparison: None.  Findings: Artifact overlies chest.  Heart size is normal. Mediastinal shadows are normal.  Lungs are clear.  No effusions. No bony abnormalities.  IMPRESSION: No active disease   Original Report Authenticated By: Paulina Fusi, M.D.   1. Sepsis   2. Acute respiratory failure   3. SOB (shortness of breath)   4. Blount's disease, unspecified laterality   5.  Leg pain, anterior, right   6. Obesity hypoventilation syndrome     MDM  Exam significant for fever, tachycardia and tachypnea and morbid obesity. Code sepsis initiated. No hospitalizations in past 90 days or recent travel. + sick contact. Likely resp source - doubt meningitis or abdominal. Initiated on rocephin/azithro, given aggressive IVF. Labs and cultures drawn. Possible PE w/ sudden onset dyspnea. Will obtain CT chest.   Course: CT neg for PE, CXR - NACPF, no wheeze on exam - doubt asthma, CMP unremarkable. Blood gas - hypoxemia - no significant acidosis. WBC 14k w/ left shift, lactic acid 4.6 - given aggressive IVF - 2L and repeat 2.62. U/a neg for infection, blood and urine cx obtained. Reassessed, c/o pain in left hip  And pain w/ ROM. No erythema or warmth. Xray obtained - no fx. ? Septic arthritis? D/w medicine and pt admitted in serious condition. At this time sepsis of unknown source.   I have personally reviewed labs and imaging and considered in my MDM. Case d/w Dr Phil Dopp, MD 02/10/13 [phone]

## 2013-02-09 NOTE — ED Notes (Signed)
Patient states headache today with intermittent nausea and emesis. States pain when moving 10/10 throbbing when resting comfortably pain 0/10. Alert answering and following commands appropriate.

## 2013-02-10 ENCOUNTER — Inpatient Hospital Stay (HOSPITAL_COMMUNITY): Payer: Medicaid Other

## 2013-02-10 ENCOUNTER — Encounter (HOSPITAL_COMMUNITY): Payer: Self-pay | Admitting: *Deleted

## 2013-02-10 DIAGNOSIS — R1032 Left lower quadrant pain: Secondary | ICD-10-CM

## 2013-02-10 DIAGNOSIS — D72829 Elevated white blood cell count, unspecified: Secondary | ICD-10-CM

## 2013-02-10 DIAGNOSIS — R51 Headache: Secondary | ICD-10-CM

## 2013-02-10 DIAGNOSIS — L03116 Cellulitis of left lower limb: Secondary | ICD-10-CM | POA: Insufficient documentation

## 2013-02-10 DIAGNOSIS — R509 Fever, unspecified: Secondary | ICD-10-CM

## 2013-02-10 DIAGNOSIS — R519 Headache, unspecified: Secondary | ICD-10-CM

## 2013-02-10 LAB — BLOOD GAS, ARTERIAL
Acid-base deficit: 0.4 mmol/L (ref 0.0–2.0)
Bicarbonate: 23.9 mEq/L (ref 20.0–24.0)
Delivery systems: POSITIVE
Drawn by: 23588
Expiratory PAP: 6
FIO2: 0.4 %
Inspiratory PAP: 14
O2 Saturation: 99.3 %
Patient temperature: 102.1
TCO2: 25.1 mmol/L (ref 0–100)
pCO2 arterial: 44.1 mmHg (ref 35.0–45.0)
pH, Arterial: 7.364 (ref 7.350–7.450)
pO2, Arterial: 126 mmHg — ABNORMAL HIGH (ref 80.0–100.0)

## 2013-02-10 LAB — COMPREHENSIVE METABOLIC PANEL
ALT: 33 U/L (ref 0–53)
AST: 38 U/L — ABNORMAL HIGH (ref 0–37)
Albumin: 3.1 g/dL — ABNORMAL LOW (ref 3.5–5.2)
Alkaline Phosphatase: 58 U/L (ref 39–117)
BUN: 9 mg/dL (ref 6–23)
CO2: 23 mEq/L (ref 19–32)
Calcium: 8 mg/dL — ABNORMAL LOW (ref 8.4–10.5)
Chloride: 104 mEq/L (ref 96–112)
Creatinine, Ser: 0.83 mg/dL (ref 0.50–1.35)
GFR calc Af Amer: >90 mL/min (ref >90)
GFR calc non Af Amer: >90 mL/min (ref >90)
Glucose, Bld: 140 mg/dL — ABNORMAL HIGH (ref 70–99)
Potassium: 4 mEq/L (ref 3.5–5.1)
Sodium: 140 mEq/L (ref 135–145)
Total Bilirubin: 0.8 mg/dL (ref 0.3–1.2)
Total Protein: 7.2 g/dL (ref 6.0–8.3)

## 2013-02-10 LAB — CBC
HCT: 39.9 % (ref 39.0–52.0)
Hemoglobin: 13.4 g/dL (ref 13.0–17.0)
MCH: 26.6 pg (ref 26.0–34.0)
MCHC: 33.6 g/dL (ref 30.0–36.0)
MCV: 79.2 fL (ref 78.0–100.0)
Platelets: 215 10*3/uL (ref 150–400)
RBC: 5.04 MIL/uL (ref 4.22–5.81)
RDW: 15.7 % — ABNORMAL HIGH (ref 11.5–15.5)
WBC: 19.7 10*3/uL — ABNORMAL HIGH (ref 4.0–10.5)

## 2013-02-10 LAB — PROTIME-INR
INR: 1.25 (ref 0.00–1.49)
Prothrombin Time: 15.4 seconds — ABNORMAL HIGH (ref 11.6–15.2)

## 2013-02-10 LAB — URINE CULTURE
Colony Count: NO GROWTH
Culture: NO GROWTH

## 2013-02-10 LAB — HEMOGLOBIN A1C
Hgb A1c MFr Bld: 6.3 % — ABNORMAL HIGH (ref <5.7)
Mean Plasma Glucose: 134 mg/dL — ABNORMAL HIGH (ref <117)

## 2013-02-10 LAB — PROCALCITONIN: Procalcitonin: 17.26 ng/mL

## 2013-02-10 LAB — CK: Total CK: 554 U/L — ABNORMAL HIGH (ref 7–232)

## 2013-02-10 LAB — LACTIC ACID, PLASMA: Lactic Acid, Venous: 1.4 mmol/L (ref 0.5–2.2)

## 2013-02-10 LAB — MRSA PCR SCREENING: MRSA by PCR: NEGATIVE

## 2013-02-10 MED ORDER — VANCOMYCIN HCL 10 G IV SOLR
1250.0000 mg | Freq: Three times a day (TID) | INTRAVENOUS | Status: DC
  Administered 2013-02-10 – 2013-02-12 (×5): 1250 mg via INTRAVENOUS
  Filled 2013-02-10 (×7): qty 1250

## 2013-02-10 MED ORDER — VANCOMYCIN HCL 10 G IV SOLR
2500.0000 mg | Freq: Once | INTRAVENOUS | Status: AC
  Administered 2013-02-10: 2500 mg via INTRAVENOUS
  Filled 2013-02-10 (×2): qty 2500

## 2013-02-10 MED ORDER — SODIUM CHLORIDE 0.9 % IV SOLN
INTRAVENOUS | Status: DC
  Administered 2013-02-10 – 2013-02-11 (×4): via INTRAVENOUS

## 2013-02-10 MED ORDER — IBUPROFEN 600 MG PO TABS
600.0000 mg | ORAL_TABLET | Freq: Four times a day (QID) | ORAL | Status: DC | PRN
  Filled 2013-02-10: qty 1

## 2013-02-10 MED ORDER — NYSTATIN 100000 UNIT/GM EX POWD
Freq: Two times a day (BID) | CUTANEOUS | Status: DC
  Administered 2013-02-10 – 2013-02-15 (×9): via TOPICAL
  Filled 2013-02-10 (×2): qty 15

## 2013-02-10 MED ORDER — SODIUM CHLORIDE 0.9 % IV SOLN
400.0000 mg | Freq: Once | INTRAVENOUS | Status: AC
  Administered 2013-02-10: 400 mg via INTRAVENOUS
  Filled 2013-02-10 (×2): qty 4

## 2013-02-10 MED ORDER — LEVALBUTEROL HCL 1.25 MG/0.5ML IN NEBU
1.2500 mg | INHALATION_SOLUTION | Freq: Four times a day (QID) | RESPIRATORY_TRACT | Status: DC
  Filled 2013-02-10 (×3): qty 0.5

## 2013-02-10 MED ORDER — CLINDAMYCIN PHOSPHATE 900 MG/50ML IV SOLN
900.0000 mg | Freq: Four times a day (QID) | INTRAVENOUS | Status: DC
  Administered 2013-02-10 – 2013-02-11 (×4): 900 mg via INTRAVENOUS
  Filled 2013-02-10 (×5): qty 50

## 2013-02-10 MED ORDER — LEVALBUTEROL HCL 1.25 MG/0.5ML IN NEBU
1.2500 mg | INHALATION_SOLUTION | Freq: Four times a day (QID) | RESPIRATORY_TRACT | Status: DC | PRN
  Filled 2013-02-10: qty 0.5

## 2013-02-10 MED ORDER — PIPERACILLIN-TAZOBACTAM 3.375 G IVPB
3.3750 g | Freq: Three times a day (TID) | INTRAVENOUS | Status: DC
  Administered 2013-02-10 – 2013-02-11 (×3): 3.375 g via INTRAVENOUS
  Filled 2013-02-10 (×5): qty 50

## 2013-02-10 MED ORDER — IOHEXOL 300 MG/ML  SOLN
100.0000 mL | Freq: Once | INTRAMUSCULAR | Status: AC | PRN
  Administered 2013-02-10: 100 mL via INTRAVENOUS

## 2013-02-10 NOTE — Progress Notes (Signed)
RT placed patient on bipap for qhs 14/6, 40%. Patient is tolerating bipap well at this time. RT will continue to monitor.

## 2013-02-10 NOTE — Progress Notes (Signed)
eLink Physician-Brief Progress Note Patient Name: Anthony Cox DOB: 20-Apr-1991 MRN: [id number]  Date of Service  02/10/2013   HPI/Events of Note   Pt continues to look poor, RR 45 T 103  eICU Interventions  Tfr to ICU likely, recheck ABG, labs and Chest xray Have bedside CCM MD reexamine this pt.   Intervention Category Major Interventions: Respiratory failure - evaluation and management;Sepsis - evaluation and management  Shan Levans 02/10/2013, 2:13 AM

## 2013-02-10 NOTE — Progress Notes (Signed)
ANTIBIOTIC CONSULT NOTE - INITIAL  Pharmacy Consult for vancomycin and zosyn Indication: Sepsis  No Known Allergies  Patient Measurements: Height: 5\' 9"  (175.3 cm) Weight: 472 lb 14.2 oz (214.5 kg) IBW/kg (Calculated) : 70.7   Vital Signs: Temp: 100.2 F (37.9 C) (07/04 1153) Temp src: Oral (07/04 1153) BP: 158/79 mmHg (07/04 1329) Pulse Rate: 112 (07/04 1418) Intake/Output from previous day: 07/03 0701 - 07/04 0700 In: 750 [I.V.:750] Out: 525 [Urine:525] Intake/Output from this shift: Total I/O In: 1035 [P.O.:360; I.V.:425; IV Piggyback:250] Out: 350 [Urine:350]  Labs:  Recent Labs  02/09/13 1449 02/09/13 1636 02/10/13 0430  WBC 14.2*  --  19.7*  HGB 15.5  --  13.4  PLT 181  --  215  CREATININE  --  0.77 0.83   Estimated Creatinine Clearance: 259.6 ml/min (by C-G formula based on Cr of 0.83). No results found for this basename: VANCOTROUGH, Leodis Binet, VANCORANDOM, GENTTROUGH, GENTPEAK, GENTRANDOM, TOBRATROUGH, TOBRAPEAK, TOBRARND, AMIKACINPEAK, AMIKACINTROU, AMIKACIN,  in the last 72 hours   Microbiology: Recent Results (from the past 720 hour(s))  URINE CULTURE     Status: None   Collection Time    02/09/13  4:25 PM      Result Value Range Status   Specimen Description URINE, RANDOM   Final   Special Requests NONE   Final   Culture  Setup Time 02/09/2013 17:29   Final   Colony Count NO GROWTH   Final   Culture NO GROWTH   Final   Report Status 02/10/2013 FINAL   Final  MRSA PCR SCREENING     Status: None   Collection Time    02/10/13 12:03 AM      Result Value Range Status   MRSA by PCR NEGATIVE  NEGATIVE Final   Comment:            The GeneXpert MRSA Assay (FDA     approved for NASAL specimens     only), is one component of a     comprehensive MRSA colonization     surveillance program. It is not     intended to diagnose MRSA     infection nor to guide or     monitor treatment for     MRSA infections.    Medical History: Past Medical History   Diagnosis Date  . Blount's disease   . OSA (obstructive sleep apnea)     noncompliant with cpap    Medications:  No medications PTA  Assessment: 22 yo male here with SOB/dizziness and Code Sepsis called.  Pharmacy has now been asked to dose vancomycin and zosyn. WBC 19.7, T max 104  02/09/13 rocephin>7/4 02/09/13 azithromycin 7/4 Vancomycin 7/4>> Zosyn  7/4>> Doxycycline  7/3>>   7/3 blood x2 7/3 urine>>ng  Plan:  -Zosyn 3.375 gm IV q8 hours -Vancomycin loading dose 2500 mg then vancomycin 1250 mg IV q8 hours. -Will check vanc trough at [facility]. -Will follow cultures and clinical progress  Talbert Cage, Pharm D 02/10/2013 2:25 PM

## 2013-02-10 NOTE — Progress Notes (Signed)
PULMONARY  / CRITICAL CARE MEDICINE  Name: Anthony Cox MRN: [id number] DOB: 08/28/1990    ADMISSION DATE:  02/09/2013 CONSULTATION DATE:  02/09/2013  REFERRING MD :  Anthony Cox PRIMARY SERVICE: PCCM  CHIEF COMPLAINT:  Leg pain, headache pain  BRIEF PATIENT DESCRIPTION: 22 y/o male with morbid obesity presented to the [facility] ED on 7/3 with fever, headache, and left thigh pain.  PCCM consulted for severe sepsis and increased WOB.  SIGNIFICANT EVENTS / STUDIES:  7/3 CT chest angio>> poor study, no clear PE, motion artifact  LINES / TUBES:   CULTURES: 7/3 blood >> 7/3 urine >> 7/3 RMSF Ab >>  ANTIBIOTICS: 7/3 ceftriaxone >>7/4 7/3 doxycycline >> 7/4 vanc >>> Zosyn 7/4>>> clinda 7/4>>>  SUBJECTIVE:   Feels better today  VITAL SIGNS: Temp:  [99.2 F (37.3 C)-104 F (40 C)] 100.2 F (37.9 C) (07/04 1153) Pulse Rate:  [99-136] 122 (07/04 1329) Resp:  [15-50] 37 (07/04 1329) BP: (101-158)/(37-97) 158/79 mmHg (07/04 1329) SpO2:  [94 %-100 %] 97 % (07/04 1329) FiO2 (%):  [40 %] 40 % (07/04 0325) Weight:  [214.5 kg (472 lb 14.2 oz)-226.799 kg (500 lb)] 214.5 kg (472 lb 14.2 oz) (07/03 2218) HEMODYNAMICS:   VENTILATOR SETTINGS: Vent Mode:  [-]  FiO2 (%):  [40 %] 40 % INTAKE / OUTPUT: Intake/Output     07/03 0701 - 07/04 0700 07/04 0701 - 07/05 0700   P.O.  360   I.V. (mL/kg) 750 (3.5) 425 (2)   IV Piggyback  250   Total Intake(mL/kg) 750 (3.5) 1035 (4.8)   Urine (mL/kg/hr) 525 350 (0.2)   Total Output 525 350   Net +225 +685        Urine Occurrence 1 x    Stool Occurrence  1 x     PHYSICAL EXAMINATION:  Gen: morbidly obese, tachypnic but speaking in full sentences, no paradoxical breathing HEENT: NCAT, PERRL, EOMi, OP clear, large neck PULM: CTA B CV: RRR, no mgr, no JVD AB: BS+, soft, nontender, no hsm Ext: warm, trace edema, no clubbing, no cyanosis Derm: no rash or skin breakdown; inner L thigh is tender, warm with erythema Neuro: A&Ox4, CN II-XII intact,  strength 5/5 in all 4 extremities   LABS:  Recent Labs Lab 02/09/13 1636 02/10/13 0430  NA 137 140  K 4.2 4.0  CL 101 104  CO2 26 23  BUN 7 9  CREATININE 0.77 0.83  GLUCOSE 148* 140*    Recent Labs Lab 02/09/13 1449 02/10/13 0430  HGB 15.5 13.4  HCT 46.1 39.9  WBC 14.2* 19.7*  PLT 181 215    CXR: no clear infiltrate EKG: Sinus tach, no ST wave changes  ASSESSMENT / PLAN:  22 y/o male with morbid obesity, sinus tachycardia, tachypnea fever, L thigh pain and headache of 1-2 days duration.  Overall picture worrisome for sepsis.  DDx here is broad and no clear unifying diagnosis exists right now.  Viral prodrome possible, but L thigh cellulitis seems most likely at this point; consider tick borne illness, meningitis given headache and recent fishing trip.    PULMONARY A: Obesity hypoventilation syndrome Tachypnea likely due to pain, sepsis, size and bed positioning; reassured by normal ABG P:   -BIPAP QHS & PRN -follow ABG -O2 as needed  CARDIOVASCULAR A: Sinus tach due to fever, sepsis;  Lactic acidosis improved P:  -MIVF > increase to 125cc/h for renal protection until 1 hour after CT scan done -tele  RENAL A:  No acute issues P:   -  monitor UOP -trend bmp  GASTROINTESTINAL A:  No acute issues P:   - heart diet   HEMATOLOGIC A:  No acute issues P:  - trend cbc  INFECTIOUS A:  Sepsis with broad ddx as outlined above. Need to r/o deep tissue infection, necrotizing faciitis P:   -need to perform imaging LLE and pelvis to eval for necrotizing faciitis -Continue doxycycline given concern possible tick exposure -D/c ceftriaxone -Start Zosyn -Start Clindamycin -Start Vancomycin to cover possible resistant staph -defer ID consult to internal medicine -consider IR guided LP, but no current definitive signs meningitis -check RMSF serology pending  ENDOCRINE A:  No acute issues P:   - Follow cbg   NEUROLOGIC A:  No acute issues P:   -APAP prn  for headache  TODAY'S SUMMARY: [name] presents with fever, leukocytosis and a rising procalcitonin. Concern for soft tissue infection given pain and erythema of the left thigh. Will do a CT of pelvis and lower extremities to evaluate for any abscess or gas forming infection.  [facility] S-ACNP  Anthony Pupa, MD, PhD 02/10/2013, 2:31 PM [facility] [phone] or if no answer [phone]

## 2013-02-10 NOTE — Progress Notes (Signed)
eLink Physician-Brief Progress Note Patient Name: Anthony Cox DOB: [birth year] MRN: [id number]  Date of Service  02/10/2013   HPI/Events of Note  Pt with ongoing sepsis and T103 with RR 40s on bipap  eICU Interventions  Give one dose IV Ibuprofen 400mg  x 1 May yet need ICU tfr    Intervention Category Major Interventions: Respiratory failure - evaluation and management;Sepsis - evaluation and management  Shan Levans 02/10/2013, 12:21 AM

## 2013-02-10 NOTE — Progress Notes (Addendum)
[facility] PROGRESS NOTE  Anthony Cox ZOX:[id number] DOB: 1990/09/18 DOA: 02/09/2013 PCP: Johny Blamer, MD  Assessment/Plan  Severe sepsis. Procalcitonin 17, white blood cell count 19.7 and rising.  Patient, however, feels improved.  Left groin pain and headache have both improved. -  Urinalysis negative -  Chest x-ray and CT anterior tests negative for infiltrate -  Possible cellulitis of the left lower extremity -  Ultrasound of the groin negative for evidence of abscess -  Consideration of LP due to headache and high fevers however the lack of nuchal rigidity or neck stiffness is reassuring -  Followup blood and urine cultures -  Repeat blood cultures if fever and greater than 24 hours since last culture -  Continue doxycycline and ceftriaxone -  Appreciate critical care assistance -  Patient advised that if he starts having diarrhea to notify us immediately so we can collect stool samples.  Acute hypoxic respiratory failure, likely due to high fevers.  Currently not acidotic. -  Continue to monitor for evidence of pneumonia -  Wean oxygen as tolerated  Obstructive sleep apnea/OHS, likely has increased dependence on positive pressure due to acute illness and high fevers -  Continue nightly BiPAP  Morbid obesity, needs continued counseling regarding importance of weight loss  Mild hyperglycemia, likely due to stress from acute infection -  Check hemoglobin A1c  Leukocytosis, trending up.   -  Repeat in AM  Diet:  Advance healthy heart Access:  PIV IVF:  Normal saline at 75 mL per hour due to increased insensible losses Proph:  Lovenox  Code Status: Full code Family Communication: Spoke with the patient and his mother Disposition Plan: If improvement in fevers  and respiratory distress possibly to medical ward telemetry bed tomorrow   Consultants:  Pulmonary critical care  Procedures:  CTA chest  Chest x-ray  Ultrasound of the  groin  Antibiotics:  Doxycycline from July 3  Ceftriaxone from July 3   HPI/Subjective:  Headache is improved slightly and fevers as been trending down this morning. With very tachypneaeic and ill-appearing overnight during episode of fevers to 104.  Concern that he may need to be intubated but he did well on BiPAP and is now on nasal canula.  Has intermittent headache, however denies neck stiffness. Left groin pain has improved.  Had a softer stool this morning without diarrhea.  Objective: Filed Vitals:   02/10/13 0100 02/10/13 0325 02/10/13 0351 02/10/13 0556  BP:  101/38 101/38   Pulse:  102 99   Temp: 102.5 F (39.2 C) 100.5 F (38.1 C)  99.7 F (37.6 C)  TempSrc: Axillary Axillary    Resp:  34 32   Height:      Weight:      SpO2:  100% 100%     Intake/Output Summary (Last 24 hours) at 02/10/13 0828 Last data filed at 02/10/13 0500  Gross per 24 hour  Intake    625 ml  Output    525 ml  Net    100 ml   Filed Weights   02/09/13 1608 02/09/13 2218  Weight: 226.799 kg (500 lb) 214.5 kg (472 lb 14.2 oz)    Exam:   General:  Obese African American male,  No acute distress  HEENT:  NCAT, MMM, exotropia of the right eye, no nuchal rigidity.  Cardiovascular:  Mild tachycardia, regular rhythm, nl S1, S2 no mrg, 2+ pulses, warm extremities  Respiratory:  Distant breath sounds, no increased WOB  Abdomen: NABS, soft, NT/ND  MSK:  Normal tone and bulk, no LEE  Neuro:  Grossly intact  Skin: Left groin is somewhat indurated, warm, and erythematous.  Mildly tender to palpation.  Data Reviewed: Basic Metabolic Panel:  Recent Labs Lab 02/09/13 1636 02/10/13 0430  NA 137 140  K 4.2 4.0  CL 101 104  CO2 26 23  GLUCOSE 148* 140*  BUN 7 9  CREATININE 0.77 0.83  CALCIUM 8.7 8.0*   Liver Function Tests:  Recent Labs Lab 02/09/13 1636 02/10/13 0430  AST 27 38*  ALT 29 33  ALKPHOS 61 58  BILITOT 0.4 0.8  PROT 7.8 7.2  ALBUMIN 3.5 3.1*    Recent  Labs Lab 02/09/13 1636  LIPASE 13   No results found for this basename: AMMONIA,  in the last 168 hours CBC:  Recent Labs Lab 02/09/13 1449 02/10/13 0430  WBC 14.2* 19.7*  NEUTROABS 12.3*  --   HGB 15.5 13.4  HCT 46.1 39.9  MCV 78.5 79.2  PLT 181 215   Cardiac Enzymes:  Recent Labs Lab 02/10/13 0130  CKTOTAL 554*   BNP (last 3 results)  Recent Labs  02/09/13 1635  PROBNP 24.9   CBG: No results found for this basename: GLUCAP,  in the last 168 hours  Recent Results (from the past 240 hour(s))  MRSA PCR SCREENING     Status: None   Collection Time    02/10/13 12:03 AM      Result Value Range Status   MRSA by PCR NEGATIVE  NEGATIVE Final   Comment:            The GeneXpert MRSA Assay (FDA     approved for NASAL specimens     only), is one component of a     comprehensive MRSA colonization     surveillance program. It is not     intended to diagnose MRSA     infection nor to guide or     monitor treatment for     MRSA infections.     Studies: Dg Hip Complete Left  02/09/2013   *RADIOLOGY REPORT*  Clinical Data: Left hip pain.  Corderius Saraceni of breath.  LEFT HIP - COMPLETE 2+ VIEW  Comparison: None.  Findings: Excreted contrast filled urinary bladder.  The pelvic rings appear intact.  Sacrum partially obscured by contrast in the urinary bladder.  There is no hip joint space narrowing.  No fracture.  The panniculus projects over the hips bilaterally.  Left hip is located.  IMPRESSION: No acute abnormality.   Original Report Authenticated By: Andreas Newport, M.D.   Ct Angio Chest Pe W/cm &/or Wo Cm  02/09/2013   *RADIOLOGY REPORT*  Clinical Data: Shortness of breath.  CT ANGIOGRAPHY CHEST  Technique:  Multidetector CT imaging of the chest using the standard protocol during bolus administration of intravenous contrast. Multiplanar reconstructed images including MIPs were obtained and reviewed to evaluate the vascular anatomy.  Contrast: OMNIPAQUE IOHEXOL 350 MG/ML SOLN   Comparison: Single view of the chest 02/09/2013 at to 1517 hours.  Findings: The study is limited by the patient's size.  There is also respiratory motion.  No obvious pulmonary embolus is identified.  Heart size is normal.  No pleural or pericardial effusion.  No axillary, hilar or mediastinal lymphadenopathy.  The lungs are grossly clear.  The visualized upper abdomen is unremarkable.  No focal bony abnormality is identified.  IMPRESSION: Limited study demonstrating no evidence of pulmonary embolus or acute cardiopulmonary disease.   Original  Report Authenticated By: Holley Dexter, M.D.   [place] Extrem Low Left Comp  02/10/2013   *RADIOLOGY REPORT*  Clinical Data: Abscess.  Groin pain.  ULTRASOUND LEFT LOWER EXTREMITY LIMITED  Technique:  Ultrasound examination of the region of interest in the left lower extremity was performed.  Comparison:  None.  Findings: Focused ultrasound scanning was performed in the left groin and and proximal left thigh.  There is no abscess identified. Soft tissues appear within normal limits.  IMPRESSION: Negative.   Original Report Authenticated By: Andreas Newport, M.D.   Dg Chest Port 1 View  02/10/2013   *RADIOLOGY REPORT*  Clinical Data: Respiratory distress.  PORTABLE CHEST - 1 VIEW  Comparison: None.  Findings: Markedly low volume chest.  Opacity at the bases probably represents atelectasis.  Cardiopericardial silhouette may be within normal limits allowing for low volumes.  IMPRESSION: Severe low volume chest.   Original Report Authenticated By: Andreas Newport, M.D.   Dg Chest Port 1 View  02/09/2013   *RADIOLOGY REPORT*  Clinical Data: Headache.  Markesha Hannig of breath.  PORTABLE CHEST - 1 VIEW  Comparison: None.  Findings: Artifact overlies chest.  Heart size is normal. Mediastinal shadows are normal.  Lungs are clear.  No effusions. No bony abnormalities.  IMPRESSION: No active disease   Original Report Authenticated By: Paulina Fusi, M.D.    Scheduled Meds: . cefTRIAXone  (ROCEPHIN)  IV  2 g Intravenous Q24H  . doxycycline (VIBRAMYCIN) IV  100 mg Intravenous Q12H  . enoxaparin (LOVENOX) injection  110 mg Subcutaneous Q24H   Continuous Infusions: . sodium chloride 125 mL/hr at 02/10/13 7829    Principal Problem:   Sepsis Active Problems:   Acute respiratory failure   SOB (shortness of breath)   Blount's disease    Time spent: 30 min    Lilyann Gravelle, [facility]  [facility] Pager [phone]. If 7PM-7AM, please contact night-coverage at [email or web address], password [facility] 02/10/2013, 8:28 AM  LOS: 1 day

## 2013-02-11 ENCOUNTER — Inpatient Hospital Stay (HOSPITAL_COMMUNITY): Payer: Medicaid Other

## 2013-02-11 DIAGNOSIS — E662 Morbid (severe) obesity with alveolar hypoventilation: Secondary | ICD-10-CM

## 2013-02-11 LAB — COMPREHENSIVE METABOLIC PANEL
ALT: 38 U/L (ref 0–53)
AST: 47 U/L — ABNORMAL HIGH (ref 0–37)
Albumin: 2.8 g/dL — ABNORMAL LOW (ref 3.5–5.2)
Alkaline Phosphatase: 47 U/L (ref 39–117)
BUN: 6 mg/dL (ref 6–23)
CO2: 22 mEq/L (ref 19–32)
Calcium: 8.4 mg/dL (ref 8.4–10.5)
Chloride: 100 mEq/L (ref 96–112)
Creatinine, Ser: 0.63 mg/dL (ref 0.50–1.35)
GFR calc Af Amer: >90 mL/min (ref >90)
GFR calc non Af Amer: >90 mL/min (ref >90)
Glucose, Bld: 147 mg/dL — ABNORMAL HIGH (ref 70–99)
Potassium: 5 mEq/L (ref 3.5–5.1)
Sodium: 134 mEq/L — ABNORMAL LOW (ref 135–145)
Total Bilirubin: 0.4 mg/dL (ref 0.3–1.2)
Total Protein: 7.7 g/dL (ref 6.0–8.3)

## 2013-02-11 LAB — CBC WITH DIFFERENTIAL/PLATELET
Basophils Absolute: 0 10*3/uL (ref 0.0–0.1)
Basophils Relative: 0 % (ref 0–1)
Eosinophils Absolute: 0 10*3/uL (ref 0.0–0.7)
Eosinophils Relative: 0 % (ref 0–5)
HCT: 38.3 % — ABNORMAL LOW (ref 39.0–52.0)
Hemoglobin: 13 g/dL (ref 13.0–17.0)
Lymphocytes Relative: 13 % (ref 12–46)
Lymphs Abs: 1.5 10*3/uL (ref 0.7–4.0)
MCH: 26.8 pg (ref 26.0–34.0)
MCHC: 33.9 g/dL (ref 30.0–36.0)
MCV: 79 fL (ref 78.0–100.0)
Monocytes Absolute: 0.8 10*3/uL (ref 0.1–1.0)
Monocytes Relative: 7 % (ref 3–12)
Neutro Abs: 9.1 10*3/uL — ABNORMAL HIGH (ref 1.7–7.7)
Neutrophils Relative %: 80 % — ABNORMAL HIGH (ref 43–77)
Platelets: 181 10*3/uL (ref 150–400)
RBC: 4.85 MIL/uL (ref 4.22–5.81)
RDW: 15.7 % — ABNORMAL HIGH (ref 11.5–15.5)
WBC: 11.4 10*3/uL — ABNORMAL HIGH (ref 4.0–10.5)

## 2013-02-11 LAB — PROCALCITONIN: Procalcitonin: 9 ng/mL

## 2013-02-11 MED ORDER — IBUPROFEN 600 MG PO TABS
600.0000 mg | ORAL_TABLET | Freq: Four times a day (QID) | ORAL | Status: DC | PRN
  Administered 2013-02-11: 600 mg via ORAL
  Filled 2013-02-11: qty 1

## 2013-02-11 MED ORDER — DEXTROSE 5 % IV SOLN
2.0000 g | Freq: Three times a day (TID) | INTRAVENOUS | Status: DC
  Administered 2013-02-11 – 2013-02-12 (×4): 2 g via INTRAVENOUS
  Filled 2013-02-11 (×7): qty 2

## 2013-02-11 NOTE — Progress Notes (Signed)
Contacted pt's mother and updated her on pt's improved condition -- pt's been afebrile, not as short of breath, not requiring as much oxygen and has a normal heart rate. Pt not experiencing any pain and ambulated safely to the bathroom. Also told mother that MD's would not do a lumbar puncture due to the inability to safely find the appropriate insertion point. We discussed the decision to leave him on the step-down unit for tonight. Pt's mom agrees with the discussed plan and is thankful for the update. Renette Butters, Viona Gilmore

## 2013-02-11 NOTE — Progress Notes (Signed)
Patient wanted to come off bipap. RT removed bipap and placed patient back on 3lpm [place]. RT will continue to monitor.

## 2013-02-11 NOTE — Progress Notes (Signed)
PULMONARY  / CRITICAL CARE MEDICINE  Name: Anthony Cox MRN: [id number] DOB: [birth year]    ADMISSION DATE:  02/09/2013 CONSULTATION DATE:  02/09/2013  REFERRING MD :  Onalee Hua PRIMARY SERVICE: [facility]  CHIEF COMPLAINT:  Leg pain, headache pain  BRIEF PATIENT DESCRIPTION: 22 y/o male with morbid obesity presented to the [facility] ED on 7/3 with fever, headache, and left thigh pain.  PCCM consulted for severe sepsis and increased WOB.  SIGNIFICANT EVENTS / STUDIES:  7/3 CT chest angio>> poor study, no clear PE, motion artifact 7/4 CT L leg > Subcutaneous edema in the medial left upper thigh. Findings are  consistent with history of cellulitis. No evidence for an abscess  collection or focal fluid collection. 7/5 trial of autoset cpap instead of bipap  MICRO: 7/3 blood >> 7/3 urine >> neg 7/3 RMSF Ab >> 7/4 MRSA screen > neg 7/4 PCT =  17.26   ANTIBIOTICS: 7/3 ceftriaxone >>7/4 7/3 doxycycline >> 7/4 vanc >>> Zosyn 7/4>>> clinda 7/4>>>  SUBJECTIVE:  Wants to get out of bed     VITAL SIGNS: Temp:  [99.5 F (37.5 C)-101.8 F (38.8 C)] 99.5 F (37.5 C) (07/05 0800) Pulse Rate:  [92-122] 92 (07/05 0455) Resp:  [26-42] 26 (07/05 0455) BP: (122-158)/(74-97) 142/74 mmHg (07/05 0455) SpO2:  [93 %-100 %] 93 % (07/05 0455) FiO2 (%):  [40 %] 40 % (07/04 2353) HEMODYNAMICS:   VENTILATOR SETTINGS: Vent Mode:  [-]  FiO2 (%):  [40 %] 40 % INTAKE / OUTPUT: Intake/Output     07/04 0701 - 07/05 0700 07/05 0701 - 07/06 0700   P.O. 360    I.V. (mL/kg) 1393.8 (6.5)    IV Piggyback 1750    Total Intake(mL/kg) 3503.8 (16.3)    Urine (mL/kg/hr) 1825 (0.4)    Total Output 1825     Net +1678.8          Stool Occurrence 1 x      PHYSICAL EXAMINATION:  Gen: morbidly obese,  nad flat in bed HEENT: NCAT, PERRL, EOMi, OP clear, large neck PULM: CTA B CV: RRR, no mgr, no JVD AB: BS+, soft, nontender, no hsm Ext: warm, trace edema, no clubbing, no cyanosis Derm: no rash or skin  breakdown; inner L thigh is tender, warm with erythema Neuro: A&Ox4, CN II-XII intact, strength 5/5 in all 4 extremities   LABS:  Recent Labs Lab 02/09/13 1636 02/10/13 0430  NA 137 140  K 4.2 4.0  CL 101 104  CO2 26 23  BUN 7 9  CREATININE 0.77 0.83  GLUCOSE 148* 140*    Recent Labs Lab 02/09/13 1449 02/10/13 0430  HGB 15.5 13.4  HCT 46.1 39.9  WBC 14.2* 19.7*  PLT 181 215       ASSESSMENT / PLAN:  22 y/o male with morbid obesity, sinus tachycardia, tachypnea fever, L thigh pain and headache of 1-2 days duration.  Overall picture worrisome for sepsis.     L thigh cellulitis seems most likely at this point     PULMONARY A: Obesity hypoventilation syndrome Tachypnea likely due to pain, sepsis, size and bed positioning; reassured by normal ABG P:    ok to resume home cpap (rx by Leonides Sake, MD) but low threshold to change to bipap    CARDIOVASCULAR A: Sinus tach due to fever, sepsis > resolved 7/5 Lactic acidosis ressolved  P:   no need for pressors or extra fluids at this point > ok to kvo and feed  RENAL A:  No acute issues P:   -monitor UOP -trend bmp  GASTROINTESTINAL A:  No acute issues P:   - heart diet   HEMATOLOGIC A:  No acute issues P:  - trend cbc  INFECTIOUS A:  Sepsis secondary to cellulitis   P:   -check RMSF serology pending - rx as per micro dashboard   ENDOCRINE A:  No acute issues P:   - Follow cbg   NEUROLOGIC A:  No acute issues P:   -APAP prn for headache   Sandrea Hughs, MD Pulmonary and Critical Care Medicine [facility] Cell [phone] After 5:30 PM or weekends, call [phone]

## 2013-02-11 NOTE — Procedures (Signed)
Called for LP IR unable given size   I am unable to feel ANY landmarks, posterior iliac crest etc Tried in upright and side position Also mental status excellent, aware of headaches, fevers Yield likely low Consider empiric coverage  LP is NOT technically safe to do  Will d/w Primary  Mcarthur Rossetti. Tyson Alias, MD, FACP Pgr: [phone] [facility]

## 2013-02-11 NOTE — Progress Notes (Signed)
RT placed patient on cpap auto 20 max and 8 min with 4lpm 02 bleed in. Patient is tolerating cpap well at this time.

## 2013-02-11 NOTE — Progress Notes (Addendum)
[facility] PROGRESS NOTE  Anthony Cox NWG:[id number] DOB: 1991/02/19 DOA: 02/09/2013 PCP: Johny Blamer, MD  Assessment/Plan  Severe sepsis. Peak procalcitonin 17, white blood cell count 19.7 on 7/4, trending down.  Most likely source is left lower extremity cellulitis, however unusual to have such high spiking fevers in the setting of just a cellulitis and HA concerning for meningitis. -  Urinalysis negative -  Chest x-ray and CT anterior tests negative for infiltrate -  Ultrasound of the groin negative for evidence of abscess -  CT scan of the left thigh showed no evidence of fasciitis -  CT head negative -  MRI cannot be completed to evaluate for meningitis secondary to body habitus -  LP could not be performed by critical care or by radiology due to body habitus -  Treat empirically for meningitis and cellulitis with vanc and ceftaz -  Clindamycin added by critical care to reduce toxin production.  Will discontinue due to clinical improvement -  Doxycycline to continue until RMSF titers back -  F/u RMSF titers -  Blood cultures NGTD -  Appreciate critical care assistance -  Patient advised that if he starts having diarrhea to notify us immediately so we can collect stool samples.  Acute hypoxic respiratory failure, likely due to high fevers in the setting of OHS.  Likely also has atelectasis -  Continue to monitor for evidence of pneumonia -  Wean oxygen as tolerated -  Incentive spirometry -  Up right positioning, in chair or ambulating as much as possible  OHS, likely has increased dependence on positive pressure due to acute illness and high fevers -  Restart CPAP this evening  Left lower extremity erythema, new from yesterday -  Duplex LE to rule out DVT -  May be related to underlying infection  Morbid obesity, needs continued counseling regarding importance of weight loss  Mild hyperglycemia, likely due to stress from acute infection -  Hemoglobin A1c 6.3  > will discuss borderline diabetes in AM  Leukocytosis, improving  Diet:  Advance healthy heart Access:  PIV IVF:  OFF Proph:  Lovenox  Code Status: Full code Family Communication: Spoke with the patient and his mother Disposition Plan:  Continue stepdown monitoring due to increased oxygen requirement today.     Consultants:  Pulmonary critical care  Procedures:  CTA chest  Chest x-ray  Ultrasound of the groin  Antibiotics:  Doxycycline from July 3  Ceftriaxone from July 3 to July 4th  Vancomycin July 4th >  Zosyn July 4th >> July 5th  Clindamycin July 4th >> July 5th  Ceftaz July 5th >>  HPI/Subjective:  Headache recurs with movement and is 8/10, worse this morning.  Denies photophobia, nausea, vomiting.  Left inner thigh feels better today.  States his breathing is normal.    Objective: Filed Vitals:   02/11/13 0455 02/11/13 0800 02/11/13 1225 02/11/13 1227  BP: 142/74 129/71 146/91   Pulse: 92 94 99   Temp: 99.5 F (37.5 C) 99.5 F (37.5 C)  100 F (37.8 C)  TempSrc: Oral Oral  Oral  Resp: 26 53 37   Height:      Weight:      SpO2: 93% 95% 92%     Intake/Output Summary (Last 24 hours) at 02/11/13 1354 Last data filed at 02/11/13 1227  Gross per 24 hour  Intake 2801.25 ml  Output   1775 ml  Net 1026.25 ml   Filed Weights   02/09/13 1608 02/09/13 2218  Weight: 226.799 kg (500 lb) 214.5 kg (472 lb 14.2 oz)    Exam:   General:  Obese African American male, Tachypneic to the 40s with shallow breathing, particularly when reclining.    HEENT:  NCAT, MMM  Cardiovascular:  RRR, nl S1, S2 no mrg, 2+ pulses, warm extremities  Respiratory:  Distant breath sounds, no increased WOB  Abdomen: NABS, soft, NT/ND  MSK:  Normal tone and bulk, no LEE  Neuro:  Grossly intact  Skin: Left groin is indurated, warm, and erythematous.  Mildly tender to palpation.  Has new erythema of the distal left leg which is nontender.    Data Reviewed: Basic  Metabolic Panel:  Recent Labs Lab 02/09/13 1636 02/10/13 0430 02/11/13 0904  NA 137 140 134*  K 4.2 4.0 5.0  CL 101 104 100  CO2 26 23 22   GLUCOSE 148* 140* 147*  BUN 7 9 6   CREATININE 0.77 0.83 0.63  CALCIUM 8.7 8.0* 8.4   Liver Function Tests:  Recent Labs Lab 02/09/13 1636 02/10/13 0430 02/11/13 0904  AST 27 38* 47*  ALT 29 33 38  ALKPHOS 61 58 47  BILITOT 0.4 0.8 0.4  PROT 7.8 7.2 7.7  ALBUMIN 3.5 3.1* 2.8*    Recent Labs Lab 02/09/13 1636  LIPASE 13   No results found for this basename: AMMONIA,  in the last 168 hours CBC:  Recent Labs Lab 02/09/13 1449 02/10/13 0430 02/11/13 0904  WBC 14.2* 19.7* 11.4*  NEUTROABS 12.3*  --  9.1*  HGB 15.5 13.4 13.0  HCT 46.1 39.9 38.3*  MCV 78.5 79.2 79.0  PLT 181 215 181   Cardiac Enzymes:  Recent Labs Lab 02/10/13 0130  CKTOTAL 554*   BNP (last 3 results)  Recent Labs  02/09/13 1635  PROBNP 24.9   CBG: No results found for this basename: GLUCAP,  in the last 168 hours  Recent Results (from the past 240 hour(s))  URINE CULTURE     Status: None   Collection Time    02/09/13  4:25 PM      Result Value Range Status   Specimen Description URINE, RANDOM   Final   Special Requests NONE   Final   Culture  Setup Time 02/09/2013 17:29   Final   Colony Count NO GROWTH   Final   Culture NO GROWTH   Final   Report Status 02/10/2013 FINAL   Final  MRSA PCR SCREENING     Status: None   Collection Time    02/10/13 12:03 AM      Result Value Range Status   MRSA by PCR NEGATIVE  NEGATIVE Final   Comment:            The GeneXpert MRSA Assay (FDA     approved for NASAL specimens     only), is one component of a     comprehensive MRSA colonization     surveillance program. It is not     intended to diagnose MRSA     infection nor to guide or     monitor treatment for     MRSA infections.     Studies: Dg Hip Complete Left  02/09/2013   *RADIOLOGY REPORT*  Clinical Data: Left hip pain.  Tilak Oakley of  breath.  LEFT HIP - COMPLETE 2+ VIEW  Comparison: None.  Findings: Excreted contrast filled urinary bladder.  The pelvic rings appear intact.  Sacrum partially obscured by contrast in the urinary bladder.  There is no hip  joint space narrowing.  No fracture.  The panniculus projects over the hips bilaterally.  Left hip is located.  IMPRESSION: No acute abnormality.   Original Report Authenticated By: Andreas Newport, M.D.   Ct Head Wo Contrast  02/11/2013   *RADIOLOGY REPORT*  Clinical Data: Fever, headache  CT HEAD WITHOUT CONTRAST  Technique:  Contiguous axial images were obtained from the base of the skull through the vertex without contrast.  Comparison: None.  Findings: No evidence of parenchymal hemorrhage or extra-axial fluid collection. No mass lesion, mass effect, or midline shift.  No CT evidence of acute infarction.  Cerebral volume is age appropriate.  No ventriculomegaly.  The visualized paranasal sinuses are essentially clear. The mastoid air cells are unopacified.  No evidence of calvarial fracture.  IMPRESSION: No evidence of acute intracranial abnormality.   Original Report Authenticated By: Charline Bills, M.D.   Ct Angio Chest Pe W/cm &/or Wo Cm  02/09/2013   *RADIOLOGY REPORT*  Clinical Data: Shortness of breath.  CT ANGIOGRAPHY CHEST  Technique:  Multidetector CT imaging of the chest using the standard protocol during bolus administration of intravenous contrast. Multiplanar reconstructed images including MIPs were obtained and reviewed to evaluate the vascular anatomy.  Contrast: OMNIPAQUE IOHEXOL 350 MG/ML SOLN  Comparison: Single view of the chest 02/09/2013 at to 1517 hours.  Findings: The study is limited by the patient's size.  There is also respiratory motion.  No obvious pulmonary embolus is identified.  Heart size is normal.  No pleural or pericardial effusion.  No axillary, hilar or mediastinal lymphadenopathy.  The lungs are grossly clear.  The visualized upper abdomen is  unremarkable.  No focal bony abnormality is identified.  IMPRESSION: Limited study demonstrating no evidence of pulmonary embolus or acute cardiopulmonary disease.   Original Report Authenticated By: Holley Dexter, M.D.   Ct Femur Left W Contrast  02/10/2013   *RADIOLOGY REPORT*  Clinical Data: Left leg cellulitis and rule out abscess.  CT OF THE LEFT FEMUR WITH CONTRAST  Contrast: OMNIPAQUE IOHEXOL 300 MG/ML  SOLN  Comparison: Left hip examination 02/09/2013  Findings: The study has technical limitations due to the patient's body habitus.  The distal left femur could not be imaged.  The area of concern in the left upper thigh was imaged.  There are enlarged lymph nodes in the left inguinal region and anterior upper thigh. There is stranding or edema around these enlarged lymph nodes.  Index lymph node measures 2.4 cm in the [place] axis on image number 70.  Images of the pelvis are markedly limited.  There is fluid in the urinary bladder.  There is no clear evidence for an abscess.  Edema extends along the subcutaneous tissues of the anterior medial upper thigh.  Left hip is located. No acute fracture.  IMPRESSION: Subcutaneous edema in the medial left upper thigh.  Findings are consistent with history of cellulitis.  No evidence for an abscess collection or focal fluid collection.  Prominent lymph nodes in the left inguinal and upper thigh region are likely reactive.   Original Report Authenticated By: Richarda Overlie, M.D.   [place] Extrem Low Left Comp  02/10/2013   *RADIOLOGY REPORT*  Clinical Data: Abscess.  Groin pain.  ULTRASOUND LEFT LOWER EXTREMITY LIMITED  Technique:  Ultrasound examination of the region of interest in the left lower extremity was performed.  Comparison:  None.  Findings: Focused ultrasound scanning was performed in the left groin and and proximal left thigh.  There is no abscess  identified. Soft tissues appear within normal limits.  IMPRESSION: Negative.   Original Report Authenticated  By: Andreas Newport, M.D.   Dg Chest Port 1 View  02/10/2013   *RADIOLOGY REPORT*  Clinical Data: Respiratory distress.  PORTABLE CHEST - 1 VIEW  Comparison: None.  Findings: Markedly low volume chest.  Opacity at the bases probably represents atelectasis.  Cardiopericardial silhouette may be within normal limits allowing for low volumes.  IMPRESSION: Severe low volume chest.   Original Report Authenticated By: Andreas Newport, M.D.   Dg Chest Port 1 View  02/09/2013   *RADIOLOGY REPORT*  Clinical Data: Headache.  Jaquane Boughner of breath.  PORTABLE CHEST - 1 VIEW  Comparison: None.  Findings: Artifact overlies chest.  Heart size is normal. Mediastinal shadows are normal.  Lungs are clear.  No effusions. No bony abnormalities.  IMPRESSION: No active disease   Original Report Authenticated By: Paulina Fusi, M.D.    Scheduled Meds: . cefTAZidime (FORTAZ)  IV  2 g Intravenous Q8H  . clindamycin (CLEOCIN) IV  900 mg Intravenous Q6H  . doxycycline (VIBRAMYCIN) IV  100 mg Intravenous Q12H  . enoxaparin (LOVENOX) injection  110 mg Subcutaneous Q24H  . nystatin   Topical BID  . vancomycin  1,250 mg Intravenous Q8H   Continuous Infusions: . sodium chloride 10 mL/hr at 02/11/13 0930    Principal Problem:   Sepsis Active Problems:   Acute respiratory failure   SOB (shortness of breath)   Blount's disease   Leukocytosis, unspecified   Morbid obesity   Fever, unspecified   Headache(784.0)   Lt groin pain    Time spent: 30 min    Anthony Cox  [facility] Pager [phone]. If 7PM-7AM, please contact night-coverage at [email or web address], password [facility] 02/11/2013, 1:54 PM  LOS: 2 days

## 2013-02-11 NOTE — Progress Notes (Signed)
ANTIBIOTIC CONSULT NOTE - INITIAL  Pharmacy Consult:  Anthony Cox Indication:  Possible meningitis / Cellulitis   No Known Allergies  Patient Measurements: Height: 5\' 9"  (175.3 cm) Weight: 472 lb 14.2 oz (214.5 kg) IBW/kg (Calculated) : 70.7   Vital Signs: Temp: 100 F (37.8 C) (07/05 1227) Temp src: Oral (07/05 1227) BP: 146/91 mmHg (07/05 1225) Pulse Rate: 99 (07/05 1225) Intake/Output from previous day: 07/04 0701 - 07/05 0700 In: 3503.8 [P.O.:360; I.V.:1393.8; IV Piggyback:1750] Out: 1825 [Urine:1825] Intake/Output from this shift: Total I/O In: 332.5 [I.V.:20; IV Piggyback:312.5] Out: 675 [Urine:675]  Labs:  Recent Labs  02/09/13 1449 02/09/13 1636 02/10/13 0430 02/11/13 0904  WBC 14.2*  --  19.7* 11.4*  HGB 15.5  --  13.4 13.0  PLT 181  --  215 181  CREATININE  --  0.77 0.83 0.63   Estimated Creatinine Clearance: 269.3 ml/min (by C-G formula based on Cr of 0.63). No results found for this basename: VANCOTROUGH, Leodis Binet, VANCORANDOM, GENTTROUGH, GENTPEAK, GENTRANDOM, TOBRATROUGH, TOBRAPEAK, TOBRARND, AMIKACINPEAK, AMIKACINTROU, AMIKACIN,  in the last 72 hours   Microbiology: Recent Results (from the past 720 hour(s))  URINE CULTURE     Status: None   Collection Time    02/09/13  4:25 PM      Result Value Range Status   Specimen Description URINE, RANDOM   Final   Special Requests NONE   Final   Culture  Setup Time 02/09/2013 17:29   Final   Colony Count NO GROWTH   Final   Culture NO GROWTH   Final   Report Status 02/10/2013 FINAL   Final  MRSA PCR SCREENING     Status: None   Collection Time    02/10/13 12:03 AM      Result Value Range Status   MRSA by PCR NEGATIVE  NEGATIVE Final   Comment:            The GeneXpert MRSA Assay (FDA     approved for NASAL specimens     only), is one component of a     comprehensive MRSA colonization     surveillance program. It is not     intended to diagnose MRSA     infection nor to guide or     monitor  treatment for     MRSA infections.           Assessment: 84 YOM admitted with SOB/dizziness.  Antibiotics initiated for possible sepsis and cellulitis.  Concerned with meningitis and Zosyn to be switched to [place].  Also concerned with tick borne illness.  Patient with excellent renal function.  Rocephin 7/3 >>7/4 Zosyn 7/4 >> 7/5 Doxy 7/3 >> Vanc 7/4 >> Clinda 7/4 >> Anthony Cox 7/5 >>  7/3 blood cx - pending 7/3 urine cx - negative 7/4 MRSA PCR - negative   Goal of Therapy:  LMWH level 4 hrs post dose: 0.3-0.6 units/mL Monitor platelets by anticoagulation protocol: Yes Vanc trough 15-20 mcg/mL    Plan:  - Fortaz 2gm IV Q8H - Vanc 1250mg  IV Q8H - Doxy 100mg  IV Q12H and Clinda 900mg  IV Q8H as ordered - Monitor renal fxn, micro data, clinical course - Continue Lovenox 120mg  SQ Q24H (0.5mg /kg/day) for BMI > 30 - VT tomorrow at 0630, LMWH level at 0400 with AM labs - F/U RMSF serology     Ivee Poellnitz D. Laney Potash, PharmD, BCPS Pager:  [phone] 02/11/2013, 1:23 PM

## 2013-02-12 ENCOUNTER — Inpatient Hospital Stay (HOSPITAL_COMMUNITY): Payer: Medicaid Other

## 2013-02-12 DIAGNOSIS — L03119 Cellulitis of unspecified part of limb: Secondary | ICD-10-CM

## 2013-02-12 DIAGNOSIS — M79609 Pain in unspecified limb: Secondary | ICD-10-CM

## 2013-02-12 DIAGNOSIS — L02419 Cutaneous abscess of limb, unspecified: Secondary | ICD-10-CM

## 2013-02-12 LAB — COMPREHENSIVE METABOLIC PANEL
ALT: 29 U/L (ref 0–53)
AST: 25 U/L (ref 0–37)
Albumin: 2.6 g/dL — ABNORMAL LOW (ref 3.5–5.2)
Alkaline Phosphatase: 52 U/L (ref 39–117)
BUN: 6 mg/dL (ref 6–23)
CO2: 23 mEq/L (ref 19–32)
Calcium: 8.8 mg/dL (ref 8.4–10.5)
Chloride: 99 mEq/L (ref 96–112)
Creatinine, Ser: 0.55 mg/dL (ref 0.50–1.35)
GFR calc Af Amer: >90 mL/min (ref >90)
GFR calc non Af Amer: >90 mL/min (ref >90)
Glucose, Bld: 142 mg/dL — ABNORMAL HIGH (ref 70–99)
Potassium: 4.5 mEq/L (ref 3.5–5.1)
Sodium: 132 mEq/L — ABNORMAL LOW (ref 135–145)
Total Bilirubin: 0.3 mg/dL (ref 0.3–1.2)
Total Protein: 7.5 g/dL (ref 6.0–8.3)

## 2013-02-12 LAB — CBC WITH DIFFERENTIAL/PLATELET
Basophils Absolute: 0 10*3/uL (ref 0.0–0.1)
Basophils Relative: 0 % (ref 0–1)
Eosinophils Absolute: 0 10*3/uL (ref 0.0–0.7)
Eosinophils Relative: 0 % (ref 0–5)
HCT: 36.8 % — ABNORMAL LOW (ref 39.0–52.0)
Hemoglobin: 12.3 g/dL — ABNORMAL LOW (ref 13.0–17.0)
Lymphocytes Relative: 13 % (ref 12–46)
Lymphs Abs: 1.1 10*3/uL (ref 0.7–4.0)
MCH: 26.3 pg (ref 26.0–34.0)
MCHC: 33.4 g/dL (ref 30.0–36.0)
MCV: 78.8 fL (ref 78.0–100.0)
Monocytes Absolute: 0.6 10*3/uL (ref 0.1–1.0)
Monocytes Relative: 7 % (ref 3–12)
Neutro Abs: 6.7 10*3/uL (ref 1.7–7.7)
Neutrophils Relative %: 79 % — ABNORMAL HIGH (ref 43–77)
Platelets: 226 10*3/uL (ref 150–400)
RBC: 4.67 MIL/uL (ref 4.22–5.81)
RDW: 15.7 % — ABNORMAL HIGH (ref 11.5–15.5)
WBC: 8.5 10*3/uL (ref 4.0–10.5)

## 2013-02-12 LAB — ROCKY MTN SPOTTED FVR AB, IGM-BLOOD: RMSF IgM: 0.81 IV (ref 0.00–0.89)

## 2013-02-12 LAB — VANCOMYCIN, TROUGH: Vancomycin Tr: 5.8 ug/mL — ABNORMAL LOW (ref 10.0–20.0)

## 2013-02-12 LAB — ROCKY MTN SPOTTED FVR AB, IGG-BLOOD: RMSF IgG: 0.16 IV

## 2013-02-12 MED ORDER — DEXTROSE 5 % IV SOLN
2.0000 g | INTRAVENOUS | Status: DC
  Administered 2013-02-12: 2 g via INTRAVENOUS
  Filled 2013-02-12 (×3): qty 2

## 2013-02-12 MED ORDER — VANCOMYCIN HCL 10 G IV SOLR
1250.0000 mg | Freq: Four times a day (QID) | INTRAVENOUS | Status: DC
  Administered 2013-02-12 – 2013-02-15 (×12): 1250 mg via INTRAVENOUS
  Filled 2013-02-12 (×15): qty 1250

## 2013-02-12 MED ORDER — FUROSEMIDE 10 MG/ML IJ SOLN
40.0000 mg | Freq: Once | INTRAMUSCULAR | Status: AC
  Administered 2013-02-12: 40 mg via INTRAVENOUS
  Filled 2013-02-12: qty 4

## 2013-02-12 MED ORDER — GUAIFENESIN-DM 100-10 MG/5ML PO SYRP: 5.0000 mL | ORAL_SOLUTION | ORAL | Status: DC | PRN

## 2013-02-12 NOTE — Progress Notes (Signed)
ANTIBIOTIC CONSULT NOTE - FOLLOW UP  Pharmacy Consult:  Vancomycin Indication:  Rule out meningitis / cellulitis  No Known Allergies  Patient Measurements: Height: 5\' 9"  (175.3 cm) Weight: 472 lb 14.2 oz (214.5 kg) IBW/kg (Calculated) : 70.7  Vital Signs: Temp: 97.6 F (36.4 C) (07/06 0350) Temp src: Axillary (07/06 0350) BP: 135/80 mmHg (07/06 0350) Pulse Rate: 90 (07/06 0350) Intake/Output from previous day: 07/05 0701 - 07/06 0700 In: 1227.5 [P.O.:480; I.V.:70; IV Piggyback:677.5] Out: 1800 [Urine:1800]  Labs:  Recent Labs  02/10/13 0430 02/11/13 0904 02/12/13 0740  WBC 19.7* 11.4* 8.5  HGB 13.4 13.0 12.3*  PLT 215 181 226  CREATININE 0.83 0.63 0.55   Estimated Creatinine Clearance: 269.3 ml/min (by C-G formula based on Cr of 0.55).  Recent Labs  02/12/13 0740  VANCOTROUGH 5.8*     Microbiology: Recent Results (from the past 720 hour(s))  URINE CULTURE     Status: None   Collection Time    02/09/13  4:25 PM      Result Value Range Status   Specimen Description URINE, RANDOM   Final   Special Requests NONE   Final   Culture  Setup Time 02/09/2013 17:29   Final   Colony Count NO GROWTH   Final   Culture NO GROWTH   Final   Report Status 02/10/2013 FINAL   Final  MRSA PCR SCREENING     Status: None   Collection Time    02/10/13 12:03 AM      Result Value Range Status   MRSA by PCR NEGATIVE  NEGATIVE Final   Comment:            The GeneXpert MRSA Assay (FDA     approved for NASAL specimens     only), is one component of a     comprehensive MRSA colonization     surveillance program. It is not     intended to diagnose MRSA     infection nor to guide or     monitor treatment for     MRSA infections.       Assessment: 68 YOM admitted with SOB/dizziness.  Antibiotics initiated for rule out sepsis, cellulitis, and possible meningitis.  Patient's renal function has been stable.  Vancomycin trough sub-therapeutic on 1250mg  IV Q8H.    Rocephin 7/3  >>7/4 Zosyn 7/4 >> 7/5 Doxy 7/3 >> Vanc 7/4 >> Clinda 7/4 >> 7/5 Elita Quick 7/5 >>  7/6 VT = 5.8 on 1250 q8h, SCr 0.55 (drawn 1.5 hrs late)  7/3 blood cx - collected 7/3 urine cx - negative 7/4 MRSA PCR - negative   Pharmacy also managing Lovenox for VTE prophylaxis.  LMWH level was not drawn on time, therefore, will reschedule for tomorrow AM.  Noted Doppler ordered to rule out DVT.   Goal of Therapy:  Vancomycin trough level 15-20 mcg/ml LMWH level 4 hrs post dose: 0.3-0.6 units/mL Monitor platelets by anticoagulation protocol: Yes   Plan:  - Increase vanc to 1250mg  IV Q6H, vanc trough tomorrow - Elita Quick 2gm IV Q8H - Doxy 100mg  IV Q12H as ordered - Monitor renal fxn, micro data, clinical course - Continue Lovenox 120mg  SQ Q24H (0.5mg /kg/day) for BMI > 30 - LMWH level with AM labs - F/U RMSF serology     Jaleya Pebley D. Laney Potash, PharmD, BCPS Pager:  [phone] 02/12/2013, 9:06 AM

## 2013-02-12 NOTE — Progress Notes (Signed)
VASCULAR LAB PRELIMINARY  PRELIMINARY  PRELIMINARY  PRELIMINARY  Left lower extremity venous duplex completed.    Preliminary report:  Left:  No obvious evidence of DVT, superficial thrombosis, or Baker's cyst.  Maleki Hippe, RVS 02/12/2013, 11:33 AM  Thereasa Parkin, RVT 02/12/2013, 11:33 AM

## 2013-02-12 NOTE — Progress Notes (Signed)
Pt bleed on his CPAP had to be increased from 4 up to 10 liters to keep his SATs up, patient sleeping/ Respirations still in the 30-40"s MD aware. Pt did not want his head raised, I explained to patient that we would have to to [facility] his SATs. Raspatory therapy called to acquire if there was anything we could to maintain SATs. Per RT he is maxed out on CPAP settings if these do not hold patient he need to be started back on BiPAP @ this time SATs are 90 to 94 on 10 liter bleed in while asleep  Will continue  To monitor

## 2013-02-12 NOTE — Progress Notes (Signed)
[facility] PROGRESS NOTE  Anthony Cox ZOX:[id number] DOB: 1990-12-28 DOA: 02/09/2013 PCP: Johny Blamer, MD  Assessment/Plan  Severe sepsis.  Peak procalcitonin 17, white blood cell count 19.7 on 7/4, trending down.  Most likely source is left lower extremity cellulitis, however unusual to have such high spiking fevers in the setting of just a cellulitis. HA may be concerning for meningitis however, he appears clinically well and does not have nuchal rigidity.   -  Urinalysis negative -  Chest x-ray and CT chest negative for infiltrate -  Ultrasound of the groin and CT scan of the left thigh showed no evidence of fasciitis or abscess -  CT head negative -  MRI and LP cannot be completed due to body habitus -  Discontinue empiric treatment for meningitis -  Continue tx for cellulitis with vanc and ceftriaxone  -  Continue Doxycycline for now -  F/u RMSF titers neg, but can be negative in acute infection -  ID consult pending -  Blood cultures NGTD -  Appreciate critical care assistance  Acute hypoxic respiratory failure, likely due to high fevers in the setting of OHS.  Likely also has atelectasis.  Hypoxia worsening and FIO2 increased. CXR with pulmonary edema possibly due to acute diastolic heart failure vs. ARDS. -  Continue to monitor for evidence of pneumonia -  Wean oxygen as tolerated -  Incentive spirometry -  Up right positioning, in chair or ambulating as much as possible -  Bipap prn -  Lasix 40mg  IV once  Headache.  DDx includes meningitis, sepsis, intermittent hypoxia, sleep deprivation, caffeine deprivation -  Tylenol and motrin and vicodin prn  OHS, likely has increased dependence on positive pressure due to acute illness and high fevers -  Bipap qhs  Left lower extremity erythema, resolved. -  Duplex LE neg   Morbid obesity, needs continued counseling regarding importance of weight loss  Mild hyperglycemia, likely due to stress from acute  infection -  Hemoglobin A1c 6.3 > will discuss borderline diabetes in AM  Leukocytosis, improving  Diet:  Advance healthy heart Access:  PIV IVF:  OFF Proph:  Lovenox  Code Status: Full code Family Communication: Spoke with the patient and his mother Disposition Plan:  Continue stepdown monitoring due to increasing oxygen requirements.    Consultants:  Pulmonary critical care  Procedures:  CTA chest  Chest x-ray  Ultrasound of the groin  CT left leg with contrast  LLE duplex   Antibiotics:  Doxycycline from July 3  Ceftriaxone from July 3 to July 4th  Vancomycin July 4th >  Zosyn July 4th >> July 5th  Clindamycin July 4th >> July 5th  Ceftaz July 5th >> July 6th  Ceftriaxone July 6th >>  HPI/Subjective:  Headache recurs with movement and may be associated with desaturations.  Denies photophobia, nausea, vomiting, neck stiffness.  Left inner thigh improving.  He is feeling more Karishma Unrein of breath today.  Objective: Filed Vitals:   02/11/13 2343 02/12/13 0350 02/12/13 0726 02/12/13 1111  BP: 126/69 135/80 147/91 123/64  Pulse: 83 90 97 82  Temp: 98.3 F (36.8 C) 97.6 F (36.4 C) 99.8 F (37.7 C) 98.5 F (36.9 C)  TempSrc: Axillary Axillary Axillary Oral  Resp: 47 49 28   Height:      Weight:      SpO2: 93% 93% 86% 88%    Intake/Output Summary (Last 24 hours) at 02/12/13 1459 Last data filed at 02/12/13 1300  Gross per 24 hour  Intake    675 ml  Output   1825 ml  Net  -1150 ml   Filed Weights   02/09/13 1608 02/09/13 2218  Weight: 226.799 kg (500 lb) 214.5 kg (472 lb 14.2 oz)    Exam:   General:  Obese African American male, hypoxic to mid 6s on 6L [place], moderate resp distress with SCM retractions, supraclavicular/suprasternal retractions.    HEENT:  NCAT, MMM  Cardiovascular:  RRR, nl S1, S2 no mrg, 2+ pulses, warm extremities  Respiratory:  Distant breath sounds, no increased WOB  Abdomen: NABS, soft, NT/ND  MSK:  Normal tone and  bulk, no LEE  Neuro:  Grossly intact  Skin: Left groin is less indurated, warm, and erythematous and receding from the previously recorded line.  Mildly tender to palpation.    Data Reviewed: Basic Metabolic Panel:  Recent Labs Lab 02/09/13 1636 02/10/13 0430 02/11/13 0904 02/12/13 0740  NA 137 140 134* 132*  K 4.2 4.0 5.0 4.5  CL 101 104 100 99  CO2 26 23 22 23   GLUCOSE 148* 140* 147* 142*  BUN 7 9 6 6   CREATININE 0.77 0.83 0.63 0.55  CALCIUM 8.7 8.0* 8.4 8.8   Liver Function Tests:  Recent Labs Lab 02/09/13 1636 02/10/13 0430 02/11/13 0904 02/12/13 0740  AST 27 38* 47* 25  ALT 29 33 38 29  ALKPHOS 61 58 47 52  BILITOT 0.4 0.8 0.4 0.3  PROT 7.8 7.2 7.7 7.5  ALBUMIN 3.5 3.1* 2.8* 2.6*    Recent Labs Lab 02/09/13 1636  LIPASE 13   No results found for this basename: AMMONIA,  in the last 168 hours CBC:  Recent Labs Lab 02/09/13 1449 02/10/13 0430 02/11/13 0904 02/12/13 0740  WBC 14.2* 19.7* 11.4* 8.5  NEUTROABS 12.3*  --  9.1* 6.7  HGB 15.5 13.4 13.0 12.3*  HCT 46.1 39.9 38.3* 36.8*  MCV 78.5 79.2 79.0 78.8  PLT 181 215 181 226   Cardiac Enzymes:  Recent Labs Lab 02/10/13 0130  CKTOTAL 554*   BNP (last 3 results)  Recent Labs  02/09/13 1635  PROBNP 24.9   CBG: No results found for this basename: GLUCAP,  in the last 168 hours  Recent Results (from the past 240 hour(s))  URINE CULTURE     Status: None   Collection Time    02/09/13  4:25 PM      Result Value Range Status   Specimen Description URINE, RANDOM   Final   Special Requests NONE   Final   Culture  Setup Time 02/09/2013 17:29   Final   Colony Count NO GROWTH   Final   Culture NO GROWTH   Final   Report Status 02/10/2013 FINAL   Final  CULTURE, BLOOD (ROUTINE X 2)     Status: None   Collection Time    02/09/13  4:35 PM      Result Value Range Status   Specimen Description BLOOD ARM LEFT   Final   Special Requests BOTTLES DRAWN AEROBIC AND ANAEROBIC 10CC   Final    Culture  Setup Time 02/10/2013 03:14   Final   Culture     Final   Value:        BLOOD CULTURE RECEIVED NO GROWTH TO DATE CULTURE WILL BE HELD FOR 5 DAYS BEFORE ISSUING A FINAL NEGATIVE REPORT   Report Status PENDING   Incomplete  CULTURE, BLOOD (ROUTINE X 2)     Status: None   Collection Time  02/09/13  4:55 PM      Result Value Range Status   Specimen Description BLOOD ARM LEFT   Final   Special Requests BOTTLES DRAWN AEROBIC AND ANAEROBIC 10CC   Final   Culture  Setup Time 02/10/2013 03:14   Final   Culture     Final   Value:        BLOOD CULTURE RECEIVED NO GROWTH TO DATE CULTURE WILL BE HELD FOR 5 DAYS BEFORE ISSUING A FINAL NEGATIVE REPORT   Report Status PENDING   Incomplete  MRSA PCR SCREENING     Status: None   Collection Time    02/10/13 12:03 AM      Result Value Range Status   MRSA by PCR NEGATIVE  NEGATIVE Final   Comment:            The GeneXpert MRSA Assay (FDA     approved for NASAL specimens     only), is one component of a     comprehensive MRSA colonization     surveillance program. It is not     intended to diagnose MRSA     infection nor to guide or     monitor treatment for     MRSA infections.     Studies: Ct Head Wo Contrast  02/11/2013   *RADIOLOGY REPORT*  Clinical Data: Fever, headache  CT HEAD WITHOUT CONTRAST  Technique:  Contiguous axial images were obtained from the base of the skull through the vertex without contrast.  Comparison: None.  Findings: No evidence of parenchymal hemorrhage or extra-axial fluid collection. No mass lesion, mass effect, or midline shift.  No CT evidence of acute infarction.  Cerebral volume is age appropriate.  No ventriculomegaly.  The visualized paranasal sinuses are essentially clear. The mastoid air cells are unopacified.  No evidence of calvarial fracture.  IMPRESSION: No evidence of acute intracranial abnormality.   Original Report Authenticated By: Charline Bills, M.D.   Ct Femur Left W Contrast  02/10/2013    *RADIOLOGY REPORT*  Clinical Data: Left leg cellulitis and rule out abscess.  CT OF THE LEFT FEMUR WITH CONTRAST  Contrast: OMNIPAQUE IOHEXOL 300 MG/ML  SOLN  Comparison: Left hip examination 02/09/2013  Findings: The study has technical limitations due to the patient's body habitus.  The distal left femur could not be imaged.  The area of concern in the left upper thigh was imaged.  There are enlarged lymph nodes in the left inguinal region and anterior upper thigh. There is stranding or edema around these enlarged lymph nodes.  Index lymph node measures 2.4 cm in the Trysten Bernard axis on image number 70.  Images of the pelvis are markedly limited.  There is fluid in the urinary bladder.  There is no clear evidence for an abscess.  Edema extends along the subcutaneous tissues of the anterior medial upper thigh.  Left hip is located. No acute fracture.  IMPRESSION: Subcutaneous edema in the medial left upper thigh.  Findings are consistent with history of cellulitis.  No evidence for an abscess collection or focal fluid collection.  Prominent lymph nodes in the left inguinal and upper thigh region are likely reactive.   Original Report Authenticated By: Richarda Overlie, M.D.   Dg Chest Port 1 View  02/12/2013   *RADIOLOGY REPORT*  Clinical Data: Worsening hypoxia and shortness of breath  PORTABLE CHEST - 1 VIEW  Comparison: 02/10/2013  Findings: Heart size is enlarged.  There is asymmetric elevation of the right hemidiaphragm.  Diffuse bilateral pulmonary opacities are unchanged from previous exam consistent with pulmonary edema.  IMPRESSION:  1.  No change in bilateral pulmonary opacities.  2.  Low lung volumes.   Original Report Authenticated By: Signa Kell, M.D.    Scheduled Meds: . cefTAZidime (FORTAZ)  IV  2 g Intravenous Q8H  . doxycycline (VIBRAMYCIN) IV  100 mg Intravenous Q12H  . enoxaparin (LOVENOX) injection  110 mg Subcutaneous Q24H  . furosemide  40 mg Intravenous Once  . nystatin   Topical BID   . vancomycin  1,250 mg Intravenous Q6H   Continuous Infusions: . sodium chloride 10 mL/hr at 02/11/13 0930    Principal Problem:   Sepsis Active Problems:   Acute respiratory failure   SOB (shortness of breath)   Blount's disease   Leukocytosis, unspecified   Morbid obesity   Fever, unspecified   Headache(784.0)   Cellulitis of leg, left    Time spent: 30 min    Anthony Cox, [facility]  [facility] Pager [phone]. If 7PM-7AM, please contact night-coverage at [email or web address], password [facility] 02/12/2013, 2:59 PM  LOS: 3 days

## 2013-02-12 NOTE — Progress Notes (Signed)
PULMONARY  / CRITICAL CARE MEDICINE  Name: Anthony Cox MRN: [id number] DOB: 24-Jul-1991    ADMISSION DATE:  02/09/2013 CONSULTATION DATE:  02/09/2013  REFERRING MD :  Onalee Hua PRIMARY SERVICE: [facility]  CHIEF COMPLAINT:  Leg pain, headache pain  BRIEF PATIENT DESCRIPTION: 22 y/o male with morbid obesity presented to the [facility] ED on 7/3 with fever, headache, and left thigh pain.  PCCM consulted for severe sepsis and increased WOB.  SIGNIFICANT EVENTS / STUDIES:  7/3 CT chest angio>> poor study, no clear PE, motion artifact 7/4 CT L leg > Subcutaneous edema in the medial left upper thigh. Findings are  consistent with history of cellulitis. No evidence for an abscess  collection or focal fluid collection. 7/5 trial of autoset cpap instead of bipap> failed so resumed bipap 7/6 7/6 venous dopplers Neg on L  MICRO: 7/3 blood >> 7/3 urine >> neg 7/3 RMSF Ab >> 7/4 MRSA screen > neg 7/4 PCT =  17.26   ANTIBIOTICS: 7/3 ceftriaxone >>7/4 7/3 doxycycline >> 7/4 vanc >>> Zosyn 7/4>>> clinda 7/4>>>  SUBJECTIVE:  Focused on positional ha, no nausea, no cp or sob at rest sitting in chair     VITAL SIGNS: Temp:  [97.6 F (36.4 C)-99.8 F (37.7 C)] 98.5 F (36.9 C) (07/06 1111) Pulse Rate:  [83-107] 97 (07/06 0726) Resp:  [14-49] 28 (07/06 0726) BP: (126-147)/(69-91) 147/91 mmHg (07/06 0726) SpO2:  [86 %-97 %] 86 % (07/06 0726) 02 rx  6lpm    INTAKE / OUTPUT: Intake/Output     07/05 0701 - 07/06 0700 07/06 0701 - 07/07 0700   P.O. 480 240   I.V. (mL/kg) 80 (0.4) 20 (0.1)   IV Piggyback 677.5    Total Intake(mL/kg) 1237.5 (5.8) 260 (1.2)   Urine (mL/kg/hr) 1800 (0.3)    Total Output 1800     Net -562.5 +260        Stool Occurrence 1 x      PHYSICAL EXAMINATION:  Gen: morbidly obese,  nad in chair HEENT: NCAT, PERRL, EOMi, OP clear, large neck PULM: CTA B CV: RRR, no mgr, no JVD AB: BS+, soft, nontender, no hsm Ext: warm, trace edema, no clubbing, no cyanosis Derm: no  rash or skin breakdown; inner L thigh is tender, warm with erythema Neuro: A&Ox4, no deficitis   LABS:  Recent Labs Lab 02/10/13 0430 02/11/13 0904 02/12/13 0740  NA 140 134* 132*  K 4.0 5.0 4.5  CL 104 100 99  CO2 23 22 23   BUN 9 6 6   CREATININE 0.83 0.63 0.55  GLUCOSE 140* 147* 142*    Recent Labs Lab 02/10/13 0430 02/11/13 0904 02/12/13 0740  HGB 13.4 13.0 12.3*  HCT 39.9 38.3* 36.8*  WBC 19.7* 11.4* 8.5  PLT 215 181 226       ASSESSMENT / PLAN:  22 y/o male with morbid obesity, sinus tachycardia, tachypnea fever, L thigh pain and headache of 1-2 days duration pta.  Overall picture worrisome for sepsis.     L thigh cellulitis seems most likely at this point     PULMONARY A: Obesity hypoventilation syndrome Tachypnea likely due to pain, sepsis, size and bed positioning;   P:   failied cpap (baseline rx per Dr Henderson [place]) so changed back to bipap 7/6    CARDIOVASCULAR A: Sinus tach due to fever, sepsis > resolved 7/5 Lactic acidosis resolved  P:   no need for pressors or extra fluids at this point > ok to kvo and feed  RENAL A:  No acute issues P:   -monitor UOP -trend bmp  GASTROINTESTINAL A:  No acute issues P:   - heart diet   HEMATOLOGIC A:  No acute issues P:  - trend cbc  INFECTIOUS A:  Sepsis secondary to cellulitis   P:   -check RMSF serology pending - rx as per micro dashboard   ENDOCRINE A:  No acute issues P:   - Follow cbg   NEUROLOGIC A:  No acute issues P:   -APAP prn for headache    Sandrea Hughs, MD Pulmonary and Critical Care Medicine [facility] Cell [phone] After 5:30 PM or weekends, call [phone]

## 2013-02-13 DIAGNOSIS — M79609 Pain in unspecified limb: Secondary | ICD-10-CM

## 2013-02-13 LAB — COMPREHENSIVE METABOLIC PANEL
ALT: 26 U/L (ref 0–53)
AST: 19 U/L (ref 0–37)
Albumin: 2.9 g/dL — ABNORMAL LOW (ref 3.5–5.2)
Alkaline Phosphatase: 50 U/L (ref 39–117)
BUN: 7 mg/dL (ref 6–23)
CO2: 29 mEq/L (ref 19–32)
Calcium: 9.2 mg/dL (ref 8.4–10.5)
Chloride: 97 mEq/L (ref 96–112)
Creatinine, Ser: 0.64 mg/dL (ref 0.50–1.35)
GFR calc Af Amer: >90 mL/min (ref >90)
GFR calc non Af Amer: >90 mL/min (ref >90)
Glucose, Bld: 118 mg/dL — ABNORMAL HIGH (ref 70–99)
Potassium: 3.6 mEq/L (ref 3.5–5.1)
Sodium: 137 mEq/L (ref 135–145)
Total Bilirubin: 0.4 mg/dL (ref 0.3–1.2)
Total Protein: 7.9 g/dL (ref 6.0–8.3)

## 2013-02-13 LAB — CBC WITH DIFFERENTIAL/PLATELET
Basophils Absolute: 0 10*3/uL (ref 0.0–0.1)
Basophils Relative: 0 % (ref 0–1)
Eosinophils Absolute: 0.1 10*3/uL (ref 0.0–0.7)
Eosinophils Relative: 1 % (ref 0–5)
HCT: 37.6 % — ABNORMAL LOW (ref 39.0–52.0)
Hemoglobin: 12.5 g/dL — ABNORMAL LOW (ref 13.0–17.0)
Lymphocytes Relative: 23 % (ref 12–46)
Lymphs Abs: 1.8 10*3/uL (ref 0.7–4.0)
MCH: 26.3 pg (ref 26.0–34.0)
MCHC: 33.2 g/dL (ref 30.0–36.0)
MCV: 79.2 fL (ref 78.0–100.0)
Monocytes Absolute: 0.8 10*3/uL (ref 0.1–1.0)
Monocytes Relative: 10 % (ref 3–12)
Neutro Abs: 5.3 10*3/uL (ref 1.7–7.7)
Neutrophils Relative %: 66 % (ref 43–77)
Platelets: 249 10*3/uL (ref 150–400)
RBC: 4.75 MIL/uL (ref 4.22–5.81)
RDW: 15.4 % (ref 11.5–15.5)
WBC: 8 10*3/uL (ref 4.0–10.5)

## 2013-02-13 LAB — PROCALCITONIN: Procalcitonin: 4.06 ng/mL

## 2013-02-13 LAB — VANCOMYCIN, TROUGH: Vancomycin Tr: 16.4 ug/mL (ref 10.0–20.0)

## 2013-02-13 LAB — HEPARIN ANTI-XA: Heparin LMW: 0.16 IU/mL

## 2013-02-13 MED ORDER — TRAMADOL HCL 50 MG PO TABS
50.0000 mg | ORAL_TABLET | Freq: Four times a day (QID) | ORAL | Status: DC | PRN
  Administered 2013-02-13: 50 mg via ORAL
  Filled 2013-02-13: qty 1

## 2013-02-13 MED ORDER — FUROSEMIDE 10 MG/ML IJ SOLN
40.0000 mg | Freq: Once | INTRAMUSCULAR | Status: AC
  Administered 2013-02-13: 40 mg via INTRAVENOUS
  Filled 2013-02-13: qty 4

## 2013-02-13 MED ORDER — ACETAMINOPHEN 325 MG PO TABS: 650.0000 mg | ORAL_TABLET | Freq: Four times a day (QID) | ORAL | Status: DC | PRN

## 2013-02-13 MED ORDER — ENOXAPARIN SODIUM 120 MG/0.8ML ~~LOC~~ SOLN
120.0000 mg | SUBCUTANEOUS | Status: DC
  Administered 2013-02-13 – 2013-02-14 (×2): 120 mg via SUBCUTANEOUS
  Filled 2013-02-13 (×3): qty 0.8

## 2013-02-13 MED ORDER — HYDROCODONE-ACETAMINOPHEN 5-325 MG PO TABS: 1.0000 | ORAL_TABLET | ORAL | Status: DC | PRN

## 2013-02-13 NOTE — Progress Notes (Signed)
Placed patient on Bipap for rest. Tolerating well at this time. RT will continue to monitor.

## 2013-02-13 NOTE — Progress Notes (Signed)
Placed pt. On BIPAP for QHS rest. Pt. Tolerating well at this time. RN aware.

## 2013-02-13 NOTE — Progress Notes (Signed)
Utilization review completed.  

## 2013-02-13 NOTE — Progress Notes (Addendum)
[facility] PROGRESS NOTE  Anthony Cox WGN:[id number] DOB: 1991/05/26 DOA: 02/09/2013 PCP: Johny Blamer, MD  Assessment/Plan  Severe sepsis.  Peak procalcitonin 17, white blood cell count 19.7 on 7/4, trending down.  Most likely source is left lower extremity cellulitis, however unusual to have such high spiking fevers in the setting of just a cellulitis. HA may be concerning for meningitis however, he appears clinically well and does not have nuchal rigidity.   -  Urinalysis negative -  Chest x-ray and CT chest negative for infiltrate -  Ultrasound of the groin and CT scan of the left thigh showed no evidence of fasciitis or abscess -  CT head negative and MRI and LP cannot be completed due to body habitus -  Continue tx for cellulitis with vanc and ceftriaxone  -  Continue Doxycycline -  F/u RMSF titers neg, but can be negative in acute infection -  ID consult pending, spoke with Dr. Daiva Eves -  Blood cultures NGTD -  Appreciate critical care assistance  Acute hypoxic respiratory failure, due to possible edema and atelectasis. Pulmonary edema may be secondary to due to ARDS from sepsis, diastolic heart failure, and appears to be improving with Lasix. -  Wean oxygen as tolerated -  Incentive spirometry -  Up right positioning, in chair or ambulating as much as possible -  Bipap each bedtime -  Lasix 40mg  IV once again this morning  Headache.  DDx includes meningitis, sepsis, intermittent hypoxia, sleep deprivation, caffeine deprivation, slowly improving -  Tylenol and motrin and vicodin prn -  Add ultram   OHS, likely has increased dependence on positive pressure due to acute illness and high fevers -  Bipap qhs  Left lower extremity erythema, present again today.  Likely stasis dermatitis. -  Duplex LE neg  -  Elevation of the left lower extremity when possible. -  Continue antibiotics -Consider wrapping with an Ace bandage for some compression    Morbid obesity,  needs continued counseling regarding importance of weight loss  Mild hyperglycemia, likely due to stress from acute infection -  Hemoglobin A1c 6.3  Leukocytosis, improving  Diet:  Advance healthy heart Access:  PIV IVF:  OFF Proph:  Lovenox  Code Status: Full code Family Communication: Spoke with the patient and his mother Disposition Plan:  Transitioned to telemetry soon. If he is doing well during the day today, may transfer to telemetry this evening. He will need each bedtime BiPAP.   Consultants:  Pulmonary critical care  Infectious disease  Procedures:  CTA chest  Chest x-ray  Ultrasound of the groin  CT left leg with contrast  LLE duplex   Antibiotics:  Doxycycline from July 3  Ceftriaxone from July 3 to July 4th  Vancomycin July 4th >  Zosyn July 4th >> July 5th  Clindamycin July 4th >> July 5th  Ceftaz July 5th >> July 6th  Ceftriaxone July 6th >>  HPI/Subjective:  Headache recurs with movement and may be associated with desaturations, and is improving.  Denies photophobia, nausea, vomiting, neck stiffness.  Left inner thigh improving, but has left the lower leg is now more painful and swollen.  He is feeling  lasts Alphus Zeck of breath today and was weaned down to 2 L nasal cannula.  Objective: Filed Vitals:   02/13/13 0345 02/13/13 0530 02/13/13 0729 02/13/13 1155  BP: 133/70  130/93   Pulse: 90 88    Temp: 98.2 F (36.8 C)  97.8 F (36.6 C) 97.9 F (  36.6 C)  TempSrc: Oral  Oral Oral  Resp: 32 40    Height:      Weight:      SpO2: 96%       Intake/Output Summary (Last 24 hours) at 02/13/13 1215 Last data filed at 02/13/13 0700  Gross per 24 hour  Intake    980 ml  Output   1250 ml  Net   -270 ml   Filed Weights   02/09/13 1608 02/09/13 2218  Weight: 226.799 kg (500 lb) 214.5 kg (472 lb 14.2 oz)    Exam:   General:  Obese African American male, oxygen saturations in the high 80s to 90% on 1 L nasal cannula was in the room.   Tachypnea to the 30s.    HEENT:  NCAT, MMM  Cardiovascular:  RRR, nl S1, S2 no mrg, 2+ pulses, warm extremities  Respiratory:  Distant breath sounds, no increased WOB  Abdomen: NABS, soft, NT/ND  MSK:  Normal tone and bulk, 1+ LEE of the left lower extremity. Right lower extremity without edema.   Neuro:  Grossly intact  Skin: Left groin is less indurated, warm, and erythematous, however induration appears to extend outside of the previously drawn line today.  Mildly tender to palpation.  Increased erythema of the distal extremity from the knee down to the ankle. Tender to palpation.   Data Reviewed: Basic Metabolic Panel:  Recent Labs Lab 02/09/13 1636 02/10/13 0430 02/11/13 0904 02/12/13 0740 02/13/13 0710  NA 137 140 134* 132* 137  K 4.2 4.0 5.0 4.5 3.6  CL 101 104 100 99 97  CO2 26 23 22 23 29   GLUCOSE 148* 140* 147* 142* 118*  BUN 7 9 6 6 7   CREATININE 0.77 0.83 0.63 0.55 0.64  CALCIUM 8.7 8.0* 8.4 8.8 9.2   Liver Function Tests:  Recent Labs Lab 02/09/13 1636 02/10/13 0430 02/11/13 0904 02/12/13 0740 02/13/13 0710  AST 27 38* 47* 25 19  ALT 29 33 38 29 26  ALKPHOS 61 58 47 52 50  BILITOT 0.4 0.8 0.4 0.3 0.4  PROT 7.8 7.2 7.7 7.5 7.9  ALBUMIN 3.5 3.1* 2.8* 2.6* 2.9*    Recent Labs Lab 02/09/13 1636  LIPASE 13   No results found for this basename: AMMONIA,  in the last 168 hours CBC:  Recent Labs Lab 02/09/13 1449 02/10/13 0430 02/11/13 0904 02/12/13 0740 02/13/13 0710  WBC 14.2* 19.7* 11.4* 8.5 8.0  NEUTROABS 12.3*  --  9.1* 6.7 5.3  HGB 15.5 13.4 13.0 12.3* 12.5*  HCT 46.1 39.9 38.3* 36.8* 37.6*  MCV 78.5 79.2 79.0 78.8 79.2  PLT 181 215 181 226 249   Cardiac Enzymes:  Recent Labs Lab 02/10/13 0130  CKTOTAL 554*   BNP (last 3 results)  Recent Labs  02/09/13 1635  PROBNP 24.9   CBG: No results found for this basename: GLUCAP,  in the last 168 hours  Recent Results (from the past 240 hour(s))  URINE CULTURE     Status:  None   Collection Time    02/09/13  4:25 PM      Result Value Range Status   Specimen Description URINE, RANDOM   Final   Special Requests NONE   Final   Culture  Setup Time 02/09/2013 17:29   Final   Colony Count NO GROWTH   Final   Culture NO GROWTH   Final   Report Status 02/10/2013 FINAL   Final  CULTURE, BLOOD (ROUTINE X 2)  Status: None   Collection Time    02/09/13  4:35 PM      Result Value Range Status   Specimen Description BLOOD ARM LEFT   Final   Special Requests BOTTLES DRAWN AEROBIC AND ANAEROBIC 10CC   Final   Culture  Setup Time 02/10/2013 03:14   Final   Culture     Final   Value:        BLOOD CULTURE RECEIVED NO GROWTH TO DATE CULTURE WILL BE HELD FOR 5 DAYS BEFORE ISSUING A FINAL NEGATIVE REPORT   Report Status PENDING   Incomplete  CULTURE, BLOOD (ROUTINE X 2)     Status: None   Collection Time    02/09/13  4:55 PM      Result Value Range Status   Specimen Description BLOOD ARM LEFT   Final   Special Requests BOTTLES DRAWN AEROBIC AND ANAEROBIC 10CC   Final   Culture  Setup Time 02/10/2013 03:14   Final   Culture     Final   Value:        BLOOD CULTURE RECEIVED NO GROWTH TO DATE CULTURE WILL BE HELD FOR 5 DAYS BEFORE ISSUING A FINAL NEGATIVE REPORT   Report Status PENDING   Incomplete  MRSA PCR SCREENING     Status: None   Collection Time    02/10/13 12:03 AM      Result Value Range Status   MRSA by PCR NEGATIVE  NEGATIVE Final   Comment:            The GeneXpert MRSA Assay (FDA     approved for NASAL specimens     only), is one component of a     comprehensive MRSA colonization     surveillance program. It is not     intended to diagnose MRSA     infection nor to guide or     monitor treatment for     MRSA infections.     Studies: Dg Chest Port 1 View  02/12/2013   *RADIOLOGY REPORT*  Clinical Data: Worsening hypoxia and shortness of breath  PORTABLE CHEST - 1 VIEW  Comparison: 02/10/2013  Findings: Heart size is enlarged.  There is asymmetric  elevation of the right hemidiaphragm.  Diffuse bilateral pulmonary opacities are unchanged from previous exam consistent with pulmonary edema.  IMPRESSION:  1.  No change in bilateral pulmonary opacities.  2.  Low lung volumes.   Original Report Authenticated By: Signa Kell, M.D.    Scheduled Meds: . cefTRIAXone (ROCEPHIN)  IV  2 g Intravenous Q24H  . doxycycline (VIBRAMYCIN) IV  100 mg Intravenous Q12H  . enoxaparin (LOVENOX) injection  120 mg Subcutaneous Q24H  . nystatin   Topical BID  . vancomycin  1,250 mg Intravenous Q6H   Continuous Infusions: . sodium chloride 10 mL/hr at 02/11/13 0930    Principal Problem:   Sepsis Active Problems:   Acute respiratory failure   SOB (shortness of breath)   Blount's disease   Leukocytosis, unspecified   Morbid obesity   Fever, unspecified   Headache(784.0)   Cellulitis of leg, left    Time spent: 30 min    Judyth Demarais  [facility] Pager [phone]. If 7PM-7AM, please contact night-coverage at [email or web address], password [facility] 02/13/2013, 12:15 PM  LOS: 4 days

## 2013-02-13 NOTE — Progress Notes (Signed)
PHARMACY PROGRESS NOTE  Pharmacy Consult for : Lovenox ; Vancomycin Indication: VTE prophylaxis ;  Presumed Cellulits  Hospital Problems Principal Problem:   Sepsis Active Problems:   Acute respiratory failure   SOB (shortness of breath)   Blount's disease   Leukocytosis, unspecified   Morbid obesity   Fever, unspecified   Headache(784.0)   Cellulitis of leg, left  Vitals: BP 130/93  Pulse 88  Temp(Src) 97.8 F (36.6 C) (Oral)  Resp 40  Ht 5\' 9"  (1.753 m)  Wt 472 lb 14.2 oz (214.5 kg)  BMI 69.8 kg/m2  SpO2 96%   Recent Labs  02/11/13 0904 02/12/13 0740 02/13/13 0710  WBC 11.4* 8.5 8.0  HGB 13.0 12.3* 12.5*  PLT 181 226 249  CREATININE 0.63 0.55 0.64   Estimated Creatinine Clearance: 269.3 ml/min (by C-G formula based on Cr of 0.64).   Microbiology: Recent Results (from the past 720 hour(s))  URINE CULTURE     Status: None   Collection Time    02/09/13  4:25 PM      Result Value Range Status   Specimen Description URINE, RANDOM   Final   Special Requests NONE   Final   Culture  Setup Time 02/09/2013 17:29   Final   Colony Count NO GROWTH   Final   Culture NO GROWTH   Final   Report Status 02/10/2013 FINAL   Final  CULTURE, BLOOD (ROUTINE X 2)     Status: None   Collection Time    02/09/13  4:35 PM      Result Value Range Status   Specimen Description BLOOD ARM LEFT   Final   Special Requests BOTTLES DRAWN AEROBIC AND ANAEROBIC 10CC   Final   Culture  Setup Time 02/10/2013 03:14   Final   Culture     Final   Value:        BLOOD CULTURE RECEIVED NO GROWTH TO DATE CULTURE WILL BE HELD FOR 5 DAYS BEFORE ISSUING A FINAL NEGATIVE REPORT   Report Status PENDING   Incomplete  CULTURE, BLOOD (ROUTINE X 2)     Status: None   Collection Time    02/09/13  4:55 PM      Result Value Range Status   Specimen Description BLOOD ARM LEFT   Final   Special Requests BOTTLES DRAWN AEROBIC AND ANAEROBIC 10CC   Final   Culture  Setup Time 02/10/2013 03:14   Final   Culture     Final   Value:        BLOOD CULTURE RECEIVED NO GROWTH TO DATE CULTURE WILL BE HELD FOR 5 DAYS BEFORE ISSUING A FINAL NEGATIVE REPORT   Report Status PENDING   Incomplete  MRSA PCR SCREENING     Status: None   Collection Time    02/10/13 12:03 AM      Result Value Range Status   MRSA by PCR NEGATIVE  NEGATIVE Final    Anti-infectives Anti-infectives   Start     Dose/Rate Route Frequency Ordered Stop   02/12/13 2200  cefTRIAXone (ROCEPHIN) 2 g in dextrose 5 % 50 mL IVPB     2 g 100 mL/hr over 30 Minutes Intravenous Every 24 hours 02/12/13 1517     02/12/13 1400  vancomycin (VANCOCIN) 1,250 mg in sodium chloride 0.9 % 250 mL IVPB     1,250 mg 166.7 mL/hr over 90 Minutes Intravenous Every 6 hours 02/12/13 0907     02/11/13 1400  cefTAZidime (FORTAZ) 2 g  in dextrose 5 % 50 mL IVPB  Status:  Discontinued     2 g 100 mL/hr over 30 Minutes Intravenous 3 times per day 02/11/13 1324 02/12/13 1517   02/10/13 2300  vancomycin (VANCOCIN) 1,250 mg in sodium chloride 0.9 % 250 mL IVPB  Status:  Discontinued     1,250 mg 166.7 mL/hr over 90 Minutes Intravenous Every 8 hours 02/10/13 1433 02/12/13 0907   02/10/13 1600  cefTRIAXone (ROCEPHIN) 2 g in dextrose 5 % 50 mL IVPB  Status:  Discontinued     2 g 100 mL/hr over 30 Minutes Intravenous Every 24 hours 02/09/13 1607 02/10/13 1423   02/10/13 1600  azithromycin (ZITHROMAX) 500 mg in dextrose 5 % 250 mL IVPB  Status:  Discontinued     500 mg 250 mL/hr over 60 Minutes Intravenous Every 24 hours 02/09/13 1607 02/09/13 2221   02/10/13 1500  clindamycin (CLEOCIN) IVPB 900 mg  Status:  Discontinued     900 mg 100 mL/hr over 30 Minutes Intravenous 4 times per day 02/10/13 1423 02/11/13 1409   02/10/13 1500  piperacillin-tazobactam (ZOSYN) IVPB 3.375 g  Status:  Discontinued     3.375 g 12.5 mL/hr over 240 Minutes Intravenous Every 8 hours 02/10/13 1433 02/11/13 1317   02/10/13 1445  vancomycin (VANCOCIN) 2,500 mg in sodium chloride 0.9 %  500 mL IVPB     2,500 mg 250 mL/hr over 120 Minutes Intravenous  Once 02/10/13 1433 02/10/13 1726   02/09/13 2145  doxycycline (VIBRAMYCIN) 100 mg in dextrose 5 % 250 mL IVPB     100 mg 125 mL/hr over 120 Minutes Intravenous Every 12 hours 02/09/13 2100     02/09/13 1545  cefTRIAXone (ROCEPHIN) 1 g in dextrose 5 % 50 mL IVPB     1 g 100 mL/hr over 30 Minutes Intravenous  Once 02/09/13 1534 02/09/13 1650   02/09/13 1545  azithromycin (ZITHROMAX) 500 mg in dextrose 5 % 250 mL IVPB     500 mg 250 mL/hr over 60 Minutes Intravenous  Once 02/09/13 1534 02/09/13 1915     Assessment:  22 y/o male on Vancomycin Day # 4, Doxycycline Day # 5, and Ceftriaxone Day # 2 for Cellulitis.  Meningitis unlikely and empiric meningitis treatment has been discontinued.  Patient is on Lovenox 0.5 mg/kg/day for VTE prophylaxis.  Afebrile, WBC's continue to trend down.  WBC/Hgb/Hct/Plts:  8.0/12.5/37.6/249 (07/07 0710) .  No bleeding complications noted   Vancomycin trough 16.2 mcg/ml.  Within therapeutic range [drawn early].  LMWH level drawn late [ 8 hr ] 0.16 mcg/ml.  Est 4 hour level ~ 0.35 mcg/ml > within therapeutic goal for VTE prophylaxis.  Goal of Therapy:   Vancomycin trough level 15-20 mcg/ml  4 hour LMWH level 0.3 -0.6 mcg/ml  Plan:   Continue Vancomycin 1250 mg IV q 6 hours.  Continue Doxycycline and Ceftriaxone as ordered.  Continue Lovenox 120 mg q 24 hours [ 0.5 mg/kg/24 hours ]. Follow up SCr, UOP, cultures, clinical course and adjust as clinically indicated.  Monitor for bleeding complications   Laurena Bering, Pharm.D.  02/13/2013 9:56 AM

## 2013-02-13 NOTE — Consult Note (Signed)
[facility]    Date of Admission:  02/09/2013    Total days of antibiotics 3              Reason for Consult: Right leg cellulitis with sepsis    Referring Physician: Dr. Renae Fickle  Principal Problem:   Sepsis Active Problems:   Acute respiratory failure   SOB (shortness of breath)   Blount's disease   Leukocytosis, unspecified   Morbid obesity   Fever, unspecified   Headache(784.0)   Cellulitis of leg, left   . cefTRIAXone (ROCEPHIN)  IV  2 g Intravenous Q24H  . doxycycline (VIBRAMYCIN) IV  100 mg Intravenous Q12H  . enoxaparin (LOVENOX) injection  120 mg Subcutaneous Q24H  . nystatin   Topical BID  . vancomycin  1,250 mg Intravenous Q6H    Recommendations: 1. Continue IV vancomycin and nystatin powder 2. Discontinue ceftriaxone and doxycycline   Assessment: Anthony Cox has left leg cellulitis with sepsis that is improving on broad empiric antibiotic therapy. I do not think he has meningitis. It would be very unusual to have cellulitis and meningitis concurrently and the pattern of his headache is not typical of meningitis. I would narrow his therapy to vancomycin alone. He appears to have candidal intertrigo in his groin which could have been an entry site for skin bacteria. I agree with continued treatment with nystatin.   HPI: Anthony Cox is a 22 y.o. male with morbid obesity who had right lower extremity cellulitis about 6 months ago treated with empiric antibiotics. That episode responded promptly as an outpatient. 4 days ago he began having sudden high fevers, rigors, left thigh pain and intermittent sharp headaches. He was admitted and found to have fever, tachycardia and tachypnea associated with left thigh cellulitis. Admission blood cultures are negative. The cause of his headaches are of concern for meningitis by lumbar puncture could not be completed due to the size. He is feeling better today with some decrease in his  left leg pain. He and his mother feel like his intermittent sharp headaches are less frequent and less severe. They tend to occur when he is moving from the bed to chair or vice versa.   Review of Systems: Constitutional: positive for chills, fevers and malaise, negative for anorexia, sweats and weight loss Eyes: negative Ears, nose, mouth, throat, and face: negative Respiratory: positive for tachypnea, negative for asthma, cough, pleurisy/chest pain and sputum Cardiovascular: negative Gastrointestinal: negative Genitourinary:negative Integument/breast: positive for mild intermittent itching bilaterally in his groin for the past 6 months  Past Medical History  Diagnosis Date  . Blount's disease   . OSA (obstructive sleep apnea)     noncompliant with cpap    History  Substance Use Topics  . Smoking status: Never Smoker   . Smokeless tobacco: Not on file  . Alcohol Use: Yes    Family History  Problem Relation Age of Onset  . Obesity    . Sarcoidosis Mother   . Diabetes Maternal Grandfather   . Prostate cancer Maternal Grandfather   . Arthritis Maternal Grandmother   . Arthritis Mother    No Known Allergies  OBJECTIVE: Blood pressure 130/93, pulse 88, temperature 97.9 F (36.6 C), temperature source Oral, resp. rate 40, height 5\' 9"  (1.753 m), weight 214.5 kg (472 lb 14.2 oz), SpO2 96.00%. General: He is morbidly obese. He is sitting up in a chair. He has to be prompted by his mother  to answer many of my questions. Neck: Supple Skin: He is slightly graying moist lesions bilaterally in his groin with some satellite lesions. Has some induration and tenderness in his medial left thigh and redness, warmth, swelling and mild tenderness of his left lower leg Lungs: Clear Cor: Distant but regular S1 and S2 with no murmurs heard Abdomen: Obese, soft nontender Neuro: alert and fully oriented with normal speech Joints and extremities: Scars from previous knee surgeries for Blounts  disease Mood and affect: Normal  Lab Results  Component Value Date   WBC 8.0 02/13/2013   HGB 12.5* 02/13/2013   HCT 37.6* 02/13/2013   MCV 79.2 02/13/2013   PLT 249 02/13/2013   BMET    Component Value Date/Time   NA 137 02/13/2013 0710   K 3.6 02/13/2013 0710   CL 97 02/13/2013 0710   CO2 29 02/13/2013 0710   GLUCOSE 118* 02/13/2013 0710   BUN 7 02/13/2013 0710   CREATININE 0.64 02/13/2013 0710   CALCIUM 9.2 02/13/2013 0710   GFRNONAA >90 02/13/2013 0710   GFRAA >90 02/13/2013 0710   Lab Results  Component Value Date   ALT 26 02/13/2013   AST 19 02/13/2013   ALKPHOS 50 02/13/2013   BILITOT 0.4 02/13/2013   Microbiology: Recent Results (from the past 240 hour(s))  URINE CULTURE     Status: None   Collection Time    02/09/13  4:25 PM      Result Value Range Status   Specimen Description URINE, RANDOM   Final   Special Requests NONE   Final   Culture  Setup Time 02/09/2013 17:29   Final   Colony Count NO GROWTH   Final   Culture NO GROWTH   Final   Report Status 02/10/2013 FINAL   Final  CULTURE, BLOOD (ROUTINE X 2)     Status: None   Collection Time    02/09/13  4:35 PM      Result Value Range Status   Specimen Description BLOOD ARM LEFT   Final   Special Requests BOTTLES DRAWN AEROBIC AND ANAEROBIC 10CC   Final   Culture  Setup Time 02/10/2013 03:14   Final   Culture     Final   Value:        BLOOD CULTURE RECEIVED NO GROWTH TO DATE CULTURE WILL BE HELD FOR 5 DAYS BEFORE ISSUING A FINAL NEGATIVE REPORT   Report Status PENDING   Incomplete  CULTURE, BLOOD (ROUTINE X 2)     Status: None   Collection Time    02/09/13  4:55 PM      Result Value Range Status   Specimen Description BLOOD ARM LEFT   Final   Special Requests BOTTLES DRAWN AEROBIC AND ANAEROBIC 10CC   Final   Culture  Setup Time 02/10/2013 03:14   Final   Culture     Final   Value:        BLOOD CULTURE RECEIVED NO GROWTH TO DATE CULTURE WILL BE HELD FOR 5 DAYS BEFORE ISSUING A FINAL NEGATIVE REPORT   Report Status PENDING    Incomplete  MRSA PCR SCREENING     Status: None   Collection Time    02/10/13 12:03 AM      Result Value Range Status   MRSA by PCR NEGATIVE  NEGATIVE Final   Comment:            The GeneXpert MRSA Assay (FDA     approved for NASAL specimens  only), is one component of a     comprehensive MRSA colonization     surveillance program. It is not     intended to diagnose MRSA     infection nor to guide or     monitor treatment for     MRSA infections.    Cliffton Asters, MD [facility] for Infect[facility] Disease [facility] [phone] pager   [phone] cell 02/13/2013, 1:27 PM

## 2013-02-14 LAB — CBC WITH DIFFERENTIAL/PLATELET
Basophils Absolute: 0 10*3/uL (ref 0.0–0.1)
Basophils Relative: 0 % (ref 0–1)
Eosinophils Absolute: 0.1 10*3/uL (ref 0.0–0.7)
Eosinophils Relative: 2 % (ref 0–5)
HCT: 38.7 % — ABNORMAL LOW (ref 39.0–52.0)
Hemoglobin: 12.7 g/dL — ABNORMAL LOW (ref 13.0–17.0)
Lymphocytes Relative: 21 % (ref 12–46)
Lymphs Abs: 2 10*3/uL (ref 0.7–4.0)
MCH: 25.8 pg — ABNORMAL LOW (ref 26.0–34.0)
MCHC: 32.8 g/dL (ref 30.0–36.0)
MCV: 78.5 fL (ref 78.0–100.0)
Monocytes Absolute: 0.7 10*3/uL (ref 0.1–1.0)
Monocytes Relative: 8 % (ref 3–12)
Neutro Abs: 6.6 10*3/uL (ref 1.7–7.7)
Neutrophils Relative %: 70 % (ref 43–77)
Platelets: 270 10*3/uL (ref 150–400)
RBC: 4.93 MIL/uL (ref 4.22–5.81)
RDW: 15.4 % (ref 11.5–15.5)
WBC: 9.5 10*3/uL (ref 4.0–10.5)

## 2013-02-14 LAB — COMPREHENSIVE METABOLIC PANEL
ALT: 24 U/L (ref 0–53)
AST: 19 U/L (ref 0–37)
Albumin: 3.1 g/dL — ABNORMAL LOW (ref 3.5–5.2)
Alkaline Phosphatase: 52 U/L (ref 39–117)
BUN: 8 mg/dL (ref 6–23)
CO2: 29 mEq/L (ref 19–32)
Calcium: 9.4 mg/dL (ref 8.4–10.5)
Chloride: 95 mEq/L — ABNORMAL LOW (ref 96–112)
Creatinine, Ser: 0.65 mg/dL (ref 0.50–1.35)
GFR calc Af Amer: >90 mL/min (ref >90)
GFR calc non Af Amer: >90 mL/min (ref >90)
Glucose, Bld: 152 mg/dL — ABNORMAL HIGH (ref 70–99)
Potassium: 3.4 mEq/L — ABNORMAL LOW (ref 3.5–5.1)
Sodium: 136 mEq/L (ref 135–145)
Total Bilirubin: 0.5 mg/dL (ref 0.3–1.2)
Total Protein: 8.1 g/dL (ref 6.0–8.3)

## 2013-02-14 MED ORDER — FUROSEMIDE 10 MG/ML IJ SOLN
40.0000 mg | Freq: Once | INTRAMUSCULAR | Status: AC
  Administered 2013-02-14: 40 mg via INTRAVENOUS
  Filled 2013-02-14: qty 4

## 2013-02-14 MED ORDER — POTASSIUM CHLORIDE CRYS ER 20 MEQ PO TBCR
20.0000 meq | EXTENDED_RELEASE_TABLET | Freq: Once | ORAL | Status: AC
  Administered 2013-02-14: 20 meq via ORAL
  Filled 2013-02-14: qty 1

## 2013-02-14 NOTE — Progress Notes (Signed)
Patient refusing to wear Bipap qhs at this time.  Patient currently on 1LNC saturating 93%.

## 2013-02-14 NOTE — Progress Notes (Signed)
Report given to unit [address] [place], [employer]. Pt will transfer to room 6N32. Will transfer via bed with belongings and bariatric chair and bed. Family at bedside.

## 2013-02-14 NOTE — Progress Notes (Signed)
Patient ID: Anthony Cox, male   DOB: 1990/10/10, 22 y.o.   MRN: [id number]         [facility]    Date of Admission:  02/09/2013           Day 4 vancomycin  Principal Problem:   Sepsis Active Problems:   Acute respiratory failure   SOB (shortness of breath)   Blount's disease   Leukocytosis, unspecified   Morbid obesity   Fever, unspecified   Headache(784.0)   Cellulitis of leg, left   . enoxaparin (LOVENOX) injection  120 mg Subcutaneous Q24H  . nystatin   Topical BID  . vancomycin  1,250 mg Intravenous Q6H    Subjective: He is feeling better.  Objective: Temp:  [98 F (36.7 C)-99.3 F (37.4 C)] 98.3 F (36.8 C) (07/08 1347) Pulse Rate:  [95-110] 105 (07/08 1347) Resp:  [32-43] 43 (07/08 0758) BP: (125-137)/(60-90) 136/72 mmHg (07/08 1347) SpO2:  [91 %-99 %] 93 % (07/08 1529)  General: Alert and comfortable in bed Lungs: Clear Cor: Regular S1 and S2 no murmurs Abdomen: Obese, soft and nontender Joints and extremities: The induration, pain and redness of his left inner thigh has improved significantly. The pain, redness, swelling and warmth of his left lower leg is unchanged.  Lab Results Lab Results  Component Value Date   WBC 9.5 02/14/2013   HGB 12.7* 02/14/2013   HCT 38.7* 02/14/2013   MCV 78.5 02/14/2013   PLT 270 02/14/2013    Lab Results  Component Value Date   CREATININE 0.65 02/14/2013   BUN 8 02/14/2013   NA 136 02/14/2013   K 3.4* 02/14/2013   CL 95* 02/14/2013   CO2 29 02/14/2013    Microbiology: Recent Results (from the past 240 hour(s))  URINE CULTURE     Status: None   Collection Time    02/09/13  4:25 PM      Result Value Range Status   Specimen Description URINE, RANDOM   Final   Special Requests NONE   Final   Culture  Setup Time 02/09/2013 17:29   Final   Colony Count NO GROWTH   Final   Culture NO GROWTH   Final   Report Status 02/10/2013 FINAL   Final  CULTURE, BLOOD (ROUTINE X 2)     Status: None   Collection  Time    02/09/13  4:35 PM      Result Value Range Status   Specimen Description BLOOD ARM LEFT   Final   Special Requests BOTTLES DRAWN AEROBIC AND ANAEROBIC 10CC   Final   Culture  Setup Time 02/10/2013 03:14   Final   Culture     Final   Value:        BLOOD CULTURE RECEIVED NO GROWTH TO DATE CULTURE WILL BE HELD FOR 5 DAYS BEFORE ISSUING A FINAL NEGATIVE REPORT   Report Status PENDING   Incomplete  CULTURE, BLOOD (ROUTINE X 2)     Status: None   Collection Time    02/09/13  4:55 PM      Result Value Range Status   Specimen Description BLOOD ARM LEFT   Final   Special Requests BOTTLES DRAWN AEROBIC AND ANAEROBIC 10CC   Final   Culture  Setup Time 02/10/2013 03:14   Final   Culture     Final   Value:        BLOOD CULTURE RECEIVED NO GROWTH TO DATE CULTURE WILL BE HELD FOR 5 DAYS  BEFORE ISSUING A FINAL NEGATIVE REPORT   Report Status PENDING   Incomplete  MRSA PCR SCREENING     Status: None   Collection Time    02/10/13 12:03 AM      Result Value Range Status   MRSA by PCR NEGATIVE  NEGATIVE Final   Comment:            The GeneXpert MRSA Assay (FDA     approved for NASAL specimens     only), is one component of a     comprehensive MRSA colonization     surveillance program. It is not     intended to diagnose MRSA     infection nor to guide or     monitor treatment for     MRSA infections.   Assessment: He is improving on therapy for left leg cellulitis.  Plan: 1. Continue IV vancomycin for now but consider switching to oral doxycycline and cephalexin soon to complete 10 days of therapy.  Cliffton Asters, MD [facility] [phone] pager   [phone] cell 02/14/2013, 3:31 PM

## 2013-02-14 NOTE — Progress Notes (Signed)
[facility] PROGRESS NOTE  Perl Kerney ZOX:[id number] DOB: [birth year] DOA: 02/09/2013 PCP: Johny Blamer, MD  Assessment/Plan  Severe sepsis.  Peak procalcitonin 17, white blood cell count 19.7 on 7/4, trending down.  Most likely source is left lower extremity cellulitis.   -  Appreciate infectious disease recommendations -  Urinalysis negative -  Chest x-ray and CT chest negative for infiltrate -  Ultrasound of the groin and CT scan of the left thigh showed no evidence of fasciitis or abscess -  CT head negative -  MRI and LP cannot be completed due to body habitus -  Continue monotherapy vancomycin -  Blood cultures NGTD  Acute hypoxic respiratory failure, due to OHS, atelectasis and pulmonary edema possible due to ARDS. -  Continue to monitor for evidence of pneumonia -  Wean oxygen as tolerated -  Incentive spirometry -  Up right positioning, in chair or ambulating as much as possible -  Bipap qhs -  Lasix 40mg  IV daily for diuresis  Headache.  DDx includes intermittent hypoxia, sleep deprivation, caffeine deprivation, resolving. -  Tylenol, motrin, ultram and vicodin prn  OHS, with significant desaturations even on CPAP.  Chronically tachypneic with low lung volumes. -  Bipap qhs  Left lower extremity erythema, likely stasis dermatitis -  Duplex LE neg   Morbid obesity, continued counseling regarding importance of weight loss -  Patient reports he has recently lost 80 pounds.  Mild hyperglycemia, likely due to stress from acute infection -  Hemoglobin A1c 6.3, discussed with family and patient  Leukocytosis, improving  Diet:   healthy heart Access:  PIV IVF:  OFF Proph:  Lovenox  Code Status: Full code Family Communication: Spoke with the patient and his mother Disposition Plan:  Transfer to telemetry with continuous pulse oximetry   Consultants:  Pulmonary critical care  Infectious disease  Procedures:  CTA chest  Chest  x-ray  Ultrasound of the groin  CT left leg with contrast  LLE duplex   Antibiotics:  Doxycycline from July 3 to July 7th  Ceftriaxone from July 3 to July 4th  Vancomycin July 4th >  Zosyn July 4th >> July 5th  Clindamycin July 4th >> July 5th  Ceftaz July 5th >> July 6th  Ceftriaxone July 6th >> July 7th  HPI/Subjective:  Headache is improving and left lower extremity groin pain is improving also. He continues to have pain in the distal left extremity. Denies fevers, chills. States his breathing is somewhat better.  Objective: Filed Vitals:   02/14/13 0400 02/14/13 0500 02/14/13 0700 02/14/13 0758  BP: 125/79   126/74  Pulse: 106 97    Temp: 98 F (36.7 C)  98.6 F (37 C)   TempSrc: Oral  Oral   Resp: 34 32  43  Height:      Weight:      SpO2: 93% 96%  95%    Intake/Output Summary (Last 24 hours) at 02/14/13 0923 Last data filed at 02/14/13 0800  Gross per 24 hour  Intake    630 ml  Output    600 ml  Net     30 ml   Filed Weights   02/09/13 1608 02/09/13 2218  Weight: 226.799 kg (500 lb) 214.5 kg (472 lb 14.2 oz)    Exam:   General:  Obese African American male, no acute distress, nasal cannula in place.  HEENT:  NCAT, MMM  Cardiovascular:  RRR, nl S1, S2 no mrg, 2+ pulses, warm extremities  Respiratory:  Distant breath sounds, no increased WOB  Abdomen: NABS, soft, NT/ND  MSK:  Normal tone and bulk, no LEE  Neuro:  Grossly intact  Skin: Left groin is less indurated, warm, and erythematous.  Left lower extremity with erythema and induration. 2+  edema.  Compared to none on the other side.  Data Reviewed: Basic Metabolic Panel:  Recent Labs Lab 02/10/13 0430 02/11/13 0904 02/12/13 0740 02/13/13 0710 02/14/13 0426  NA 140 134* 132* 137 136  K 4.0 5.0 4.5 3.6 3.4*  CL 104 100 99 97 95*  CO2 23 22 23 29 29   GLUCOSE 140* 147* 142* 118* 152*  BUN 9 6 6 7 8   CREATININE 0.83 0.63 0.55 0.64 0.65  CALCIUM 8.0* 8.4 8.8 9.2 9.4   Liver  Function Tests:  Recent Labs Lab 02/10/13 0430 02/11/13 0904 02/12/13 0740 02/13/13 0710 02/14/13 0426  AST 38* 47* 25 19 19   ALT 33 38 29 26 24   ALKPHOS 58 47 52 50 52  BILITOT 0.8 0.4 0.3 0.4 0.5  PROT 7.2 7.7 7.5 7.9 8.1  ALBUMIN 3.1* 2.8* 2.6* 2.9* 3.1*    Recent Labs Lab 02/09/13 1636  LIPASE 13   No results found for this basename: AMMONIA,  in the last 168 hours CBC:  Recent Labs Lab 02/09/13 1449 02/10/13 0430 02/11/13 0904 02/12/13 0740 02/13/13 0710 02/14/13 0426  WBC 14.2* 19.7* 11.4* 8.5 8.0 9.5  NEUTROABS 12.3*  --  9.1* 6.7 5.3 6.6  HGB 15.5 13.4 13.0 12.3* 12.5* 12.7*  HCT 46.1 39.9 38.3* 36.8* 37.6* 38.7*  MCV 78.5 79.2 79.0 78.8 79.2 78.5  PLT 181 215 181 226 249 270   Cardiac Enzymes:  Recent Labs Lab 02/10/13 0130  CKTOTAL 554*   BNP (last 3 results)  Recent Labs  02/09/13 1635  PROBNP 24.9   CBG: No results found for this basename: GLUCAP,  in the last 168 hours  Recent Results (from the past 240 hour(s))  URINE CULTURE     Status: None   Collection Time    02/09/13  4:25 PM      Result Value Range Status   Specimen Description URINE, RANDOM   Final   Special Requests NONE   Final   Culture  Setup Time 02/09/2013 17:29   Final   Colony Count NO GROWTH   Final   Culture NO GROWTH   Final   Report Status 02/10/2013 FINAL   Final  CULTURE, BLOOD (ROUTINE X 2)     Status: None   Collection Time    02/09/13  4:35 PM      Result Value Range Status   Specimen Description BLOOD ARM LEFT   Final   Special Requests BOTTLES DRAWN AEROBIC AND ANAEROBIC 10CC   Final   Culture  Setup Time 02/10/2013 03:14   Final   Culture     Final   Value:        BLOOD CULTURE RECEIVED NO GROWTH TO DATE CULTURE WILL BE HELD FOR 5 DAYS BEFORE ISSUING A FINAL NEGATIVE REPORT   Report Status PENDING   Incomplete  CULTURE, BLOOD (ROUTINE X 2)     Status: None   Collection Time    02/09/13  4:55 PM      Result Value Range Status   Specimen  Description BLOOD ARM LEFT   Final   Special Requests BOTTLES DRAWN AEROBIC AND ANAEROBIC 10CC   Final   Culture  Setup Time 02/10/2013 03:14   Final  Culture     Final   Value:        BLOOD CULTURE RECEIVED NO GROWTH TO DATE CULTURE WILL BE HELD FOR 5 DAYS BEFORE ISSUING A FINAL NEGATIVE REPORT   Report Status PENDING   Incomplete  MRSA PCR SCREENING     Status: None   Collection Time    02/10/13 12:03 AM      Result Value Range Status   MRSA by PCR NEGATIVE  NEGATIVE Final   Comment:            The GeneXpert MRSA Assay (FDA     approved for NASAL specimens     only), is one component of a     comprehensive MRSA colonization     surveillance program. It is not     intended to diagnose MRSA     infection nor to guide or     monitor treatment for     MRSA infections.     Studies: Dg Chest Port 1 View  02/12/2013   *RADIOLOGY REPORT*  Clinical Data: Worsening hypoxia and shortness of breath  PORTABLE CHEST - 1 VIEW  Comparison: 02/10/2013  Findings: Heart size is enlarged.  There is asymmetric elevation of the right hemidiaphragm.  Diffuse bilateral pulmonary opacities are unchanged from previous exam consistent with pulmonary edema.  IMPRESSION:  1.  No change in bilateral pulmonary opacities.  2.  Low lung volumes.   Original Report Authenticated By: Signa Kell, M.D.    Scheduled Meds: . enoxaparin (LOVENOX) injection  120 mg Subcutaneous Q24H  . nystatin   Topical BID  . vancomycin  1,250 mg Intravenous Q6H   Continuous Infusions: . sodium chloride 10 mL/hr at 02/11/13 0930    Principal Problem:   Sepsis Active Problems:   Acute respiratory failure   SOB (shortness of breath)   Blount's disease   Leukocytosis, unspecified   Morbid obesity   Fever, unspecified   Headache(784.0)   Cellulitis of leg, left    Time spent: 30 min    Sami Froh, [facility]  [facility] Pager [phone]. If 7PM-7AM, please contact night-coverage at [email or web address], password  [facility] 02/14/2013, 9:23 AM  LOS: 5 days

## 2013-02-15 LAB — BASIC METABOLIC PANEL
BUN: 8 mg/dL (ref 6–23)
CO2: 27 mEq/L (ref 19–32)
Calcium: 9.2 mg/dL (ref 8.4–10.5)
Chloride: 99 mEq/L (ref 96–112)
Creatinine, Ser: 0.58 mg/dL (ref 0.50–1.35)
GFR calc Af Amer: >90 mL/min (ref >90)
GFR calc non Af Amer: >90 mL/min (ref >90)
Glucose, Bld: 115 mg/dL — ABNORMAL HIGH (ref 70–99)
Potassium: 4.2 mEq/L (ref 3.5–5.1)
Sodium: 137 mEq/L (ref 135–145)

## 2013-02-15 MED ORDER — DOXYCYCLINE HYCLATE 50 MG PO CAPS: 100.0000 mg | ORAL_CAPSULE | Freq: Two times a day (BID) | ORAL | Status: DC

## 2013-02-15 MED ORDER — HYDROCODONE-ACETAMINOPHEN 5-325 MG PO TABS: 1.0000 | ORAL_TABLET | ORAL | Status: DC | PRN

## 2013-02-15 MED ORDER — ONDANSETRON HCL 4 MG PO TABS: 4.0000 mg | ORAL_TABLET | Freq: Three times a day (TID) | ORAL | Status: DC | PRN

## 2013-02-15 MED ORDER — CEPHALEXIN 500 MG PO CAPS: 500.0000 mg | ORAL_CAPSULE | Freq: Four times a day (QID) | ORAL | Status: DC

## 2013-02-15 NOTE — Progress Notes (Signed)
Pt taken off of C-PAP and placed on room air.  Pt on continuous pulse ox....will cont to monitor.

## 2013-02-15 NOTE — Discharge Summary (Signed)
Physician Discharge Summary  Anthony Cox ZOX:[id number] DOB: 1991-07-11 DOA: 02/09/2013  PCP: Johny Blamer, MD  Admit date: 02/09/2013 Discharge date: 02/15/2013  Time spent: 35 minutes  Recommendations for Outpatient Follow-up:  PCP for wound check in 1 week  Discharge Diagnoses:  Principal Problem:   Sepsis Active Problems:   Acute respiratory failure   SOB (shortness of breath)   Blount's disease   Leukocytosis, unspecified   Morbid obesity   Fever, unspecified   Headache(784.0)   Cellulitis of leg, left   Discharge Condition: improved  Diet recommendation: low fat  Filed Weights   02/09/13 1608 02/09/13 2218  Weight: 226.799 kg (500 lb) 214.5 kg (472 lb 14.2 oz)    History of present illness:  22 yo male with h/o osa noncompliant with cpap at home, blounts disease with no recent surgeries, morbid obesity who comes in with high fever that started today with chills. He is otherwise healthy. Denies cough, no n/v/diarrhea. No abd pain. No cp. Is very sob more than usual. Denies any rashes or dysuria. No le edema or swelling. He is having left inner thigh/groin pain that is new. No rash present. Denies any scrotal swelling or penile discharge. No recent abx. He did have a uri several weeks ago. No recent traveling. He is also complaining of a headache since yesterday   Hospital Course:  Severe sepsis. Peak procalcitonin 17, white blood cell count 19.7 on 7/4, trending down. Most likely source is left lower extremity cellulitis.  - Appreciate infectious disease recommendations  - Urinalysis negative  - Chest x-ray and CT chest negative for infiltrate  - Ultrasound of the groin and CT scan of the left thigh showed no evidence of fasciitis or abscess  - CT head negative  - MRI and LP cannot be completed due to body habitus  - Continue monotherapy vancomycin until d/c then change to PO doxy/cephalexin - Blood cultures NGTD   Acute hypoxic respiratory failure, due to OHS,  atelectasis and pulmonary edema possible due to ARDS.  - Incentive spirometry  - Up right positioning, in chair or ambulating as much as possible  - Bipap qhs   Headache. DDx includes intermittent hypoxia, sleep deprivation, caffeine deprivation, resolving.  - Tylenol, motrin, ultram and vicodin prn   OHS, with significant desaturations even on CPAP. Chronically tachypneic with low lung volumes.  - Bipap qhs   Left lower extremity erythema, likely stasis dermatitis  - Duplex LE neg   Morbid obesity, continued counseling regarding importance of weight loss  - Patient reports he has recently lost 80 pounds.   Mild hyperglycemia, likely due to stress from acute infection  - Hemoglobin A1c 6.3, discussed with family and patient   Procedures:  none  Consultations:  PCCM  ID  Discharge Exam: Filed Vitals:   02/14/13 1834 02/14/13 2103 02/15/13 0332 02/15/13 0547  BP: 140/83 140/82  123/57  Pulse: 106 112 88 98  Temp: 99.1 F (37.3 C) 99.2 F (37.3 C)  97.6 F (36.4 C)  TempSrc: Oral Oral  Axillary  Resp: 26 25 21 23   Height:      Weight:      SpO2: 90% 92% 97% 94%    General: A+Ox3, NAD- asking to go home Cardiovascular: rrr Respiratory: diminished  Discharge Instructions      Discharge Orders   Future Orders Complete By Expires     Discharge instructions  As directed     Comments:      Low fat diet CPAP  at night    Increase activity slowly  As directed         Medication List         cephALEXin 500 MG capsule  Commonly known as:  KEFLEX  Take 1 capsule (500 mg total) by mouth 4 (four) times daily.     doxycycline 50 MG capsule  Commonly known as:  VIBRAMYCIN  Take 2 capsules (100 mg total) by mouth 2 (two) times daily.     HYDROcodone-acetaminophen 5-325 MG per tablet  Commonly known as:  NORCO/VICODIN  Take 1-2 tablets by mouth every 4 (four) hours as needed.     ondansetron 4 MG tablet  Commonly known as:  ZOFRAN  Take 1 tablet (4 mg  total) by mouth every 8 (eight) hours as needed for nausea.       No Known Allergies    The results of significant diagnostics from this hospitalization (including imaging, microbiology, ancillary and laboratory) are listed below for reference.    Significant Diagnostic Studies: Dg Hip Complete Left  02/09/2013   *RADIOLOGY REPORT*  Clinical Data: Left hip pain.  Short of breath.  LEFT HIP - COMPLETE 2+ VIEW  Comparison: None.  Findings: Excreted contrast filled urinary bladder.  The pelvic rings appear intact.  Sacrum partially obscured by contrast in the urinary bladder.  There is no hip joint space narrowing.  No fracture.  The panniculus projects over the hips bilaterally.  Left hip is located.  IMPRESSION: No acute abnormality.   Original Report Authenticated By: Andreas Newport, M.D.   Ct Head Wo Contrast  02/11/2013   *RADIOLOGY REPORT*  Clinical Data: Fever, headache  CT HEAD WITHOUT CONTRAST  Technique:  Contiguous axial images were obtained from the base of the skull through the vertex without contrast.  Comparison: None.  Findings: No evidence of parenchymal hemorrhage or extra-axial fluid collection. No mass lesion, mass effect, or midline shift.  No CT evidence of acute infarction.  Cerebral volume is age appropriate.  No ventriculomegaly.  The visualized paranasal sinuses are essentially clear. The mastoid air cells are unopacified.  No evidence of calvarial fracture.  IMPRESSION: No evidence of acute intracranial abnormality.   Original Report Authenticated By: Charline Bills, M.D.   Ct Angio Chest Pe W/cm &/or Wo Cm  02/09/2013   *RADIOLOGY REPORT*  Clinical Data: Shortness of breath.  CT ANGIOGRAPHY CHEST  Technique:  Multidetector CT imaging of the chest using the standard protocol during bolus administration of intravenous contrast. Multiplanar reconstructed images including MIPs were obtained and reviewed to evaluate the vascular anatomy.  Contrast: OMNIPAQUE IOHEXOL 350  MG/ML SOLN  Comparison: Single view of the chest 02/09/2013 at to 1517 hours.  Findings: The study is limited by the patient's size.  There is also respiratory motion.  No obvious pulmonary embolus is identified.  Heart size is normal.  No pleural or pericardial effusion.  No axillary, hilar or mediastinal lymphadenopathy.  The lungs are grossly clear.  The visualized upper abdomen is unremarkable.  No focal bony abnormality is identified.  IMPRESSION: Limited study demonstrating no evidence of pulmonary embolus or acute cardiopulmonary disease.   Original Report Authenticated By: Holley Dexter, M.D.   Ct Femur Left W Contrast  02/10/2013   *RADIOLOGY REPORT*  Clinical Data: Left leg cellulitis and rule out abscess.  CT OF THE LEFT FEMUR WITH CONTRAST  Contrast: OMNIPAQUE IOHEXOL 300 MG/ML  SOLN  Comparison: Left hip examination 02/09/2013  Findings: The study has technical limitations due  to the patient's body habitus.  The distal left femur could not be imaged.  The area of concern in the left upper thigh was imaged.  There are enlarged lymph nodes in the left inguinal region and anterior upper thigh. There is stranding or edema around these enlarged lymph nodes.  Index lymph node measures 2.4 cm in the short axis on image number 70.  Images of the pelvis are markedly limited.  There is fluid in the urinary bladder.  There is no clear evidence for an abscess.  Edema extends along the subcutaneous tissues of the anterior medial upper thigh.  Left hip is located. No acute fracture.  IMPRESSION: Subcutaneous edema in the medial left upper thigh.  Findings are consistent with history of cellulitis.  No evidence for an abscess collection or focal fluid collection.  Prominent lymph nodes in the left inguinal and upper thigh region are likely reactive.   Original Report Authenticated By: Richarda Overlie, M.D.   [place] Extrem Low Left Comp  02/10/2013   *RADIOLOGY REPORT*  Clinical Data: Abscess.  Groin pain.   ULTRASOUND LEFT LOWER EXTREMITY LIMITED  Technique:  Ultrasound examination of the region of interest in the left lower extremity was performed.  Comparison:  None.  Findings: Focused ultrasound scanning was performed in the left groin and and proximal left thigh.  There is no abscess identified. Soft tissues appear within normal limits.  IMPRESSION: Negative.   Original Report Authenticated By: Andreas Newport, M.D.   Dg Chest Port 1 View  02/12/2013   *RADIOLOGY REPORT*  Clinical Data: Worsening hypoxia and shortness of breath  PORTABLE CHEST - 1 VIEW  Comparison: 02/10/2013  Findings: Heart size is enlarged.  There is asymmetric elevation of the right hemidiaphragm.  Diffuse bilateral pulmonary opacities are unchanged from previous exam consistent with pulmonary edema.  IMPRESSION:  1.  No change in bilateral pulmonary opacities.  2.  Low lung volumes.   Original Report Authenticated By: Signa Kell, M.D.   Dg Chest Port 1 View  02/10/2013   *RADIOLOGY REPORT*  Clinical Data: Respiratory distress.  PORTABLE CHEST - 1 VIEW  Comparison: None.  Findings: Markedly low volume chest.  Opacity at the bases probably represents atelectasis.  Cardiopericardial silhouette may be within normal limits allowing for low volumes.  IMPRESSION: Severe low volume chest.   Original Report Authenticated By: Andreas Newport, M.D.   Dg Chest Port 1 View  02/09/2013   *RADIOLOGY REPORT*  Clinical Data: Headache.  Short of breath.  PORTABLE CHEST - 1 VIEW  Comparison: None.  Findings: Artifact overlies chest.  Heart size is normal. Mediastinal shadows are normal.  Lungs are clear.  No effusions. No bony abnormalities.  IMPRESSION: No active disease   Original Report Authenticated By: Paulina Fusi, M.D.    Microbiology: Recent Results (from the past 240 hour(s))  URINE CULTURE     Status: None   Collection Time    02/09/13  4:25 PM      Result Value Range Status   Specimen Description URINE, RANDOM   Final   Special  Requests NONE   Final   Culture  Setup Time 02/09/2013 17:29   Final   Colony Count NO GROWTH   Final   Culture NO GROWTH   Final   Report Status 02/10/2013 FINAL   Final  CULTURE, BLOOD (ROUTINE X 2)     Status: None   Collection Time    02/09/13  4:35 PM      Result Value  Range Status   Specimen Description BLOOD ARM LEFT   Final   Special Requests BOTTLES DRAWN AEROBIC AND ANAEROBIC 10CC   Final   Culture  Setup Time 02/10/2013 03:14   Final   Culture     Final   Value:        BLOOD CULTURE RECEIVED NO GROWTH TO DATE CULTURE WILL BE HELD FOR 5 DAYS BEFORE ISSUING A FINAL NEGATIVE REPORT   Report Status PENDING   Incomplete  CULTURE, BLOOD (ROUTINE X 2)     Status: None   Collection Time    02/09/13  4:55 PM      Result Value Range Status   Specimen Description BLOOD ARM LEFT   Final   Special Requests BOTTLES DRAWN AEROBIC AND ANAEROBIC 10CC   Final   Culture  Setup Time 02/10/2013 03:14   Final   Culture     Final   Value:        BLOOD CULTURE RECEIVED NO GROWTH TO DATE CULTURE WILL BE HELD FOR 5 DAYS BEFORE ISSUING A FINAL NEGATIVE REPORT   Report Status PENDING   Incomplete  MRSA PCR SCREENING     Status: None   Collection Time    02/10/13 12:03 AM      Result Value Range Status   MRSA by PCR NEGATIVE  NEGATIVE Final   Comment:            The GeneXpert MRSA Assay (FDA     approved for NASAL specimens     only), is one component of a     comprehensive MRSA colonization     surveillance program. It is not     intended to diagnose MRSA     infection nor to guide or     monitor treatment for     MRSA infections.     Labs: Basic Metabolic Panel:  Recent Labs Lab 02/11/13 0904 02/12/13 0740 02/13/13 0710 02/14/13 0426 02/15/13 0700  NA 134* 132* 137 136 137  K 5.0 4.5 3.6 3.4* 4.2  CL 100 99 97 95* 99  CO2 22 23 29 29 27   GLUCOSE 147* 142* 118* 152* 115*  BUN 6 6 7 8 8   CREATININE 0.63 0.55 0.64 0.65 0.58  CALCIUM 8.4 8.8 9.2 9.4 9.2   Liver Function  Tests:  Recent Labs Lab 02/10/13 0430 02/11/13 0904 02/12/13 0740 02/13/13 0710 02/14/13 0426  AST 38* 47* 25 19 19   ALT 33 38 29 26 24   ALKPHOS 58 47 52 50 52  BILITOT 0.8 0.4 0.3 0.4 0.5  PROT 7.2 7.7 7.5 7.9 8.1  ALBUMIN 3.1* 2.8* 2.6* 2.9* 3.1*    Recent Labs Lab 02/09/13 1636  LIPASE 13   No results found for this basename: AMMONIA,  in the last 168 hours CBC:  Recent Labs Lab 02/09/13 1449 02/10/13 0430 02/11/13 0904 02/12/13 0740 02/13/13 0710 02/14/13 0426  WBC 14.2* 19.7* 11.4* 8.5 8.0 9.5  NEUTROABS 12.3*  --  9.1* 6.7 5.3 6.6  HGB 15.5 13.4 13.0 12.3* 12.5* 12.7*  HCT 46.1 39.9 38.3* 36.8* 37.6* 38.7*  MCV 78.5 79.2 79.0 78.8 79.2 78.5  PLT 181 215 181 226 249 270   Cardiac Enzymes:  Recent Labs Lab 02/10/13 0130  CKTOTAL 554*   BNP: BNP (last 3 results)  Recent Labs  02/09/13 1635  PROBNP 24.9   CBG: No results found for this basename: GLUCAP,  in the last 168 hours     Signed:  Benjamine Mola,  Aadi Bordner  [facility] 02/15/2013, 3:13 PM

## 2013-02-15 NOTE — Progress Notes (Signed)
Pt remains on Room air.  O2 sats are 91-92%.  Got patient OOB and he ambulated to the bathroom.  He was a little SOB, but sats remained 91-93%.  MD notified that patient requesting to go home.  Will cont to monitor.

## 2013-02-15 NOTE — Progress Notes (Signed)
Pt and mother given discharge instructions and prescriptions.  They verbalized understanding of all instructions.  Pt and mother taken down via wheelchairs for discharge home

## 2013-02-16 LAB — CULTURE, BLOOD (ROUTINE X 2)
Culture: NO GROWTH
Culture: NO GROWTH

## 2013-11-06 ENCOUNTER — Other Ambulatory Visit: Payer: Self-pay | Admitting: Family Medicine

## 2013-11-06 ENCOUNTER — Other Ambulatory Visit (HOSPITAL_COMMUNITY): Payer: Self-pay | Admitting: Family Medicine

## 2013-11-06 DIAGNOSIS — R609 Edema, unspecified: Secondary | ICD-10-CM | POA: Diagnosis not present

## 2013-11-06 DIAGNOSIS — R0602 Shortness of breath: Secondary | ICD-10-CM | POA: Diagnosis not present

## 2013-11-06 DIAGNOSIS — M79609 Pain in unspecified limb: Secondary | ICD-10-CM | POA: Diagnosis not present

## 2013-11-06 DIAGNOSIS — M7989 Other specified soft tissue disorders: Secondary | ICD-10-CM

## 2013-11-06 DIAGNOSIS — R7309 Other abnormal glucose: Secondary | ICD-10-CM | POA: Diagnosis not present

## 2013-11-06 DIAGNOSIS — J309 Allergic rhinitis, unspecified: Secondary | ICD-10-CM | POA: Diagnosis not present

## 2013-11-07 ENCOUNTER — Other Ambulatory Visit (HOSPITAL_COMMUNITY): Payer: Self-pay | Admitting: Family Medicine

## 2013-11-07 ENCOUNTER — Ambulatory Visit (HOSPITAL_COMMUNITY)
Admission: RE | Admit: 2013-11-07 | Discharge: 2013-11-07 | Disposition: A | Discharge status: Home / Self Care | Payer: Medicare Other | Source: Ambulatory Visit | Attending: Family Medicine | Admitting: Family Medicine

## 2013-11-07 DIAGNOSIS — M79609 Pain in unspecified limb: Secondary | ICD-10-CM | POA: Diagnosis not present

## 2013-11-07 DIAGNOSIS — M7989 Other specified soft tissue disorders: Secondary | ICD-10-CM

## 2013-11-07 DIAGNOSIS — R609 Edema, unspecified: Secondary | ICD-10-CM | POA: Insufficient documentation

## 2013-11-07 DIAGNOSIS — R0989 Other specified symptoms and signs involving the circulatory and respiratory systems: Secondary | ICD-10-CM | POA: Diagnosis not present

## 2013-11-07 DIAGNOSIS — R059 Cough, unspecified: Secondary | ICD-10-CM | POA: Diagnosis not present

## 2013-11-07 DIAGNOSIS — M25469 Effusion, unspecified knee: Secondary | ICD-10-CM | POA: Diagnosis not present

## 2013-11-07 DIAGNOSIS — R0609 Other forms of dyspnea: Secondary | ICD-10-CM | POA: Diagnosis not present

## 2013-11-07 DIAGNOSIS — R52 Pain, unspecified: Secondary | ICD-10-CM

## 2013-11-07 DIAGNOSIS — R0602 Shortness of breath: Secondary | ICD-10-CM

## 2013-11-07 DIAGNOSIS — M25569 Pain in unspecified knee: Secondary | ICD-10-CM | POA: Insufficient documentation

## 2013-11-07 DIAGNOSIS — R05 Cough: Secondary | ICD-10-CM | POA: Insufficient documentation

## 2013-11-07 NOTE — Progress Notes (Signed)
*  PRELIMINARY RESULTS* Vascular Ultrasound Left lower extremity venous duplex has been completed.  Preliminary findings: Technically limited due to body habitus. No obvious DVT noted in visualized veins.  Called report to Dr. Kenton Kingfisher' office. Spoke with Blanch Media. She will relay results to Dr. Kenton Kingfisher.    Landry Mellow, RDMS, RVT  11/07/2013, 11:16 AM

## 2013-11-21 ENCOUNTER — Encounter (HOSPITAL_COMMUNITY): Payer: Self-pay | Admitting: Emergency Medicine

## 2013-11-21 ENCOUNTER — Emergency Department (HOSPITAL_COMMUNITY)
Admission: EM | Admit: 2013-11-21 | Discharge: 2013-11-21 | Disposition: A | Discharge status: Home / Self Care | Payer: Medicare Other | Attending: Emergency Medicine | Admitting: Emergency Medicine

## 2013-11-21 DIAGNOSIS — D170 Benign lipomatous neoplasm of skin and subcutaneous tissue of head, face and neck: Secondary | ICD-10-CM | POA: Diagnosis not present

## 2013-11-21 DIAGNOSIS — Z8669 Personal history of other diseases of the nervous system and sense organs: Secondary | ICD-10-CM | POA: Insufficient documentation

## 2013-11-21 DIAGNOSIS — Z791 Long term (current) use of non-steroidal anti-inflammatories (NSAID): Secondary | ICD-10-CM | POA: Diagnosis not present

## 2013-11-21 DIAGNOSIS — Z79899 Other long term (current) drug therapy: Secondary | ICD-10-CM | POA: Diagnosis not present

## 2013-11-21 DIAGNOSIS — D1739 Benign lipomatous neoplasm of skin and subcutaneous tissue of other sites: Secondary | ICD-10-CM | POA: Diagnosis not present

## 2013-11-21 DIAGNOSIS — D171 Benign lipomatous neoplasm of skin and subcutaneous tissue of trunk: Secondary | ICD-10-CM

## 2013-11-21 NOTE — Discharge Instructions (Signed)
Take tylenol or motrin as needed for pain. You can also try an ice pack or warm compress.  Follow up with your doctor.  You can return to the ER for recheck if you have worsening pain/swelling, drainage, fever.  Lipoma A lipoma is a noncancerous (benign) tumor composed of fat cells. They are usually found under the skin (subcutaneous). A lipoma may occur in any tissue of the body that contains fat. Common areas for lipomas to appear include the back, shoulders, buttocks, and thighs. Lipomas are a very common soft tissue growth. They are soft and grow slowly. Most problems caused by a lipoma depend on where it is growing. DIAGNOSIS  A lipoma can be diagnosed with a physical exam. These tumors rarely become cancerous, but radiographic studies can help determine this for certain. Studies used may include:  Computerized X-ray scans (CT or CAT scan).  Computerized magnetic scans (MRI). TREATMENT  Small lipomas that are not causing problems may be watched. If a lipoma continues to enlarge or causes problems, removal is often the best treatment. Lipomas can also be removed to improve appearance. Surgery is done to remove the fatty cells and the surrounding capsule. Most often, this is done with medicine that numbs the area (local anesthetic). The removed tissue is examined under a microscope to make sure it is not cancerous. Keep all follow-up appointments with your caregiver. SEEK MEDICAL CARE IF:   The lipoma becomes larger or hard.  The lipoma becomes painful, red, or increasingly swollen. These could be signs of infection or a more serious condition. Document Released: 07/17/2002 Document Revised: 10/19/2011 Document Reviewed: 12/27/2009 [facility] Patient Information 2014 [place], [place].

## 2013-11-21 NOTE — ED Provider Notes (Signed)
CSN: [id number]     Arrival date & time 11/21/13  2105 History  This chart was scribed for non-physician practitioner working with Ephraim Hamburger, MD by Mercy Moore, ED Scribe. This patient was seen in room WTR5/WTR5 and the patient's care was started at 10:38 PM.   Chief Complaint  Patient presents with  . Skin Problem      HPI HPI Comments: Anthony Cox is a 23 y.o. male who presents to the Emergency Department complaining of an pain and swelling on upper back just below the neck, onset five days ago. Patient states that the swelling initially started off as a small bump. However as the the days progressed he says that the area has grown in size, has become more painful and has firmed. Patient reports the area is not itchy. Patient has not attempted any forms of treatment. Patient denies injury to back. Medical issues: Patient says that two weeks ago his blood sugar was found to be high, but he denies being diabetic.   Past Medical History  Diagnosis Date  . Blount's disease   . OSA (obstructive sleep apnea)     noncompliant with cpap   Past Surgical History  Procedure Laterality Date  . Knee surgery     Family History  Problem Relation Age of Onset  . Obesity    . Sarcoidosis Mother   . Diabetes Maternal Grandfather   . Prostate cancer Maternal Grandfather   . Arthritis Maternal Grandmother   . Arthritis Mother    History  Substance Use Topics  . Smoking status: Never Smoker   . Smokeless tobacco: Not on file  . Alcohol Use: Yes    Review of Systems  Constitutional: Negative for fever.  Skin:       Swelling at upper back.      Allergies  Review of patient's allergies indicates no known allergies.  Home Medications   Prior to Admission medications   Medication Sig Start Date End Date Taking? Authorizing Provider  furosemide (LASIX) 20 MG tablet Take 20 mg by mouth daily.   Yes Historical Provider, MD  meloxicam (MOBIC) 15 MG tablet Take 15 mg by mouth  daily.   Yes Historical Provider, MD   Triage Vitals: BP 162/116  Pulse 102  Temp(Src) 99.1 F (37.3 C) (Oral)  SpO2 98% Physical Exam  Nursing note and vitals reviewed. Constitutional: He is oriented to person, place, and time. He appears well-developed and well-nourished. No distress.  HENT:  Head: Normocephalic and atraumatic.  Eyes:  Normal appearance  Neck: Normal range of motion.  Pulmonary/Chest: Effort normal.  Musculoskeletal: Normal range of motion.  5-6 inch diameter soft tissue mass w/out erythema, induration, fluctuance, mid-line upper back.  There are two 1-21mm pink macular lesions at the center that look as though they may be from an insect bite. Mildly ttp.    Neurological: He is alert and oriented to person, place, and time.  Psychiatric: He has a normal mood and affect. His behavior is normal.    ED Course  Procedures (including critical care time) DIAGNOSTIC STUDIES: Oxygen Saturation is 98% on room air, normal by my interpretation.    COORDINATION OF CARE: 10:50 PM- Discussed treatment plan with patient at bedside and patient agreed to plan.     Labs Review Labs Reviewed - No data to display  Imaging Review No results found.   EKG Interpretation None      MDM   Final diagnoses:  Lipoma of back  Morbidly obese but otherwise healthy 22yo presents w/ non-traumatic edema/pain mid-line upper back x 2 weeks.  On exam, soft tissue mass w/out induration, fluctuance or overlying erythema, mildly ttp, no subq fluid collection on bedside US.  Dr. Regenia Skeeter has examined and agrees that this is unlikely to be an infectious process and may be a lipoma.  I recommended tylenol/motrin prn, heat or ice, f/u w/ PCP.  Return precautions discussed.   I personally performed the services described in this documentation, which was scribed in my presence. The recorded information has been reviewed and is accurate.    Remer Macho, PA-C 11/22/13 [phone]

## 2013-11-21 NOTE — ED Notes (Signed)
Pt states that he noticed a sore on his back x 5 days ago and now he has a bump back there. Alert and oriented.

## 2013-11-23 NOTE — ED Provider Notes (Signed)
Medical screening examination/treatment/procedure(s) were conducted as a shared visit with non-physician practitioner(s) and myself.  I personally evaluated the patient during the encounter.   EKG Interpretation None      patient with a nonspecific mass to his upper back. Minimal tenderness and seems a little mobile. No erythema, fluctuance, or abscess. Ultrasound shows no fluid collection. Difficult exam due to his obesity was likely a lipoma. Will refer to his PCP.  Ephraim Hamburger, MD 11/23/13 [phone]

## 2013-11-28 DIAGNOSIS — L02219 Cutaneous abscess of trunk, unspecified: Secondary | ICD-10-CM | POA: Diagnosis not present

## 2013-11-28 DIAGNOSIS — IMO0001 Reserved for inherently not codable concepts without codable children: Secondary | ICD-10-CM | POA: Diagnosis not present

## 2013-11-28 DIAGNOSIS — R609 Edema, unspecified: Secondary | ICD-10-CM | POA: Diagnosis not present

## 2014-04-06 DIAGNOSIS — E119 Type 2 diabetes mellitus without complications: Secondary | ICD-10-CM | POA: Diagnosis not present

## 2014-04-06 DIAGNOSIS — R609 Edema, unspecified: Secondary | ICD-10-CM | POA: Diagnosis not present

## 2014-04-06 DIAGNOSIS — B353 Tinea pedis: Secondary | ICD-10-CM | POA: Diagnosis not present

## 2014-07-23 DIAGNOSIS — H04123 Dry eye syndrome of bilateral lacrimal glands: Secondary | ICD-10-CM | POA: Diagnosis not present

## 2014-07-23 DIAGNOSIS — E119 Type 2 diabetes mellitus without complications: Secondary | ICD-10-CM | POA: Diagnosis not present

## 2014-08-16 DIAGNOSIS — E1165 Type 2 diabetes mellitus with hyperglycemia: Secondary | ICD-10-CM | POA: Diagnosis not present

## 2014-08-18 ENCOUNTER — Emergency Department (HOSPITAL_COMMUNITY): Payer: Medicare Other

## 2014-08-18 ENCOUNTER — Emergency Department (HOSPITAL_COMMUNITY)
Admission: EM | Admit: 2014-08-18 | Discharge: 2014-08-18 | Disposition: A | Discharge status: Home / Self Care | Payer: Medicare Other | Attending: Emergency Medicine | Admitting: Emergency Medicine

## 2014-08-18 ENCOUNTER — Encounter (HOSPITAL_COMMUNITY): Payer: Self-pay | Admitting: *Deleted

## 2014-08-18 DIAGNOSIS — R0602 Shortness of breath: Secondary | ICD-10-CM | POA: Diagnosis not present

## 2014-08-18 DIAGNOSIS — R0789 Other chest pain: Secondary | ICD-10-CM | POA: Diagnosis not present

## 2014-08-18 DIAGNOSIS — Z8739 Personal history of other diseases of the musculoskeletal system and connective tissue: Secondary | ICD-10-CM | POA: Diagnosis not present

## 2014-08-18 DIAGNOSIS — E119 Type 2 diabetes mellitus without complications: Secondary | ICD-10-CM | POA: Insufficient documentation

## 2014-08-18 DIAGNOSIS — Z8669 Personal history of other diseases of the nervous system and sense organs: Secondary | ICD-10-CM | POA: Diagnosis not present

## 2014-08-18 DIAGNOSIS — R52 Pain, unspecified: Secondary | ICD-10-CM | POA: Diagnosis present

## 2014-08-18 DIAGNOSIS — I517 Cardiomegaly: Secondary | ICD-10-CM | POA: Diagnosis not present

## 2014-08-18 HISTORY — DX: Morbid (severe) obesity due to excess calories: E66.01

## 2014-08-18 HISTORY — DX: Type 2 diabetes mellitus without complications: E11.9

## 2014-08-18 LAB — URINALYSIS, ROUTINE W REFLEX MICROSCOPIC
Bilirubin Urine: NEGATIVE
Glucose, UA: NEGATIVE mg/dL
Hgb urine dipstick: NEGATIVE
Ketones, ur: NEGATIVE mg/dL
Leukocytes, UA: NEGATIVE
Nitrite: NEGATIVE
Protein, ur: NEGATIVE mg/dL
Specific Gravity, Urine: 1.026 (ref 1.005–1.030)
Urobilinogen, UA: 0.2 mg/dL (ref 0.0–1.0)
pH: 5 (ref 5.0–8.0)

## 2014-08-18 NOTE — Discharge Instructions (Signed)

## 2014-08-18 NOTE — ED Notes (Signed)
Pt reports left side pain since this am. Denies n/v/d or urinary symptoms. Recently going to the gym and unsure if he injured area.

## 2014-08-18 NOTE — ED Notes (Signed)
Pt knows that urine is needed

## 2014-08-18 NOTE — ED Provider Notes (Signed)
CSN: [id number]     Arrival date & time 08/18/14  1008 History   First MD Initiated Contact with Patient 08/18/14 1009     Chief Complaint  Patient presents with  . Pain     (Consider location/radiation/quality/duration/timing/severity/associated sxs/prior Treatment) Patient is a 24 y.o. male presenting with flank pain.  Flank Pain This is a new problem. The current episode started 6 to 12 hours ago. The problem occurs constantly. The problem has not changed since onset.Pertinent negatives include no chest pain, no abdominal pain, no headaches and no shortness of breath. The symptoms are aggravated by twisting and bending. The symptoms are relieved by position. He has tried nothing for the symptoms.    Past Medical History  Diagnosis Date  . Blount's disease   . OSA (obstructive sleep apnea)     noncompliant with cpap  . Diabetes mellitus without complication   . Morbid obesity    Past Surgical History  Procedure Laterality Date  . Knee surgery     Family History  Problem Relation Age of Onset  . Obesity    . Sarcoidosis Mother   . Diabetes Maternal Grandfather   . Prostate cancer Maternal Grandfather   . Arthritis Maternal Grandmother   . Arthritis Mother    History  Substance Use Topics  . Smoking status: Never Smoker   . Smokeless tobacco: Not on file  . Alcohol Use: Yes    Review of Systems  Respiratory: Negative for shortness of breath.   Cardiovascular: Negative for chest pain.  Gastrointestinal: Negative for abdominal pain.  Genitourinary: Positive for flank pain.  Neurological: Negative for headaches.  All other systems reviewed and are negative.     Allergies  Review of patient's allergies indicates no known allergies.  Home Medications   Prior to Admission medications   Medication Sig Start Date End Date Taking? Authorizing Provider  acetaminophen (TYLENOL) 325 MG tablet Take 650 mg by mouth every 6 (six) hours as needed for mild pain.   Yes  Historical Provider, MD  metFORMIN (GLUCOPHAGE-XR) 500 MG 24 hr tablet Take 500 mg by mouth daily. 08/11/14  Yes Historical Provider, MD   BP 130/72 mmHg  Pulse 87  Temp(Src) 98.1 F (36.7 C) (Oral)  Resp 22  SpO2 90% Physical Exam  Constitutional: He is oriented to person, place, and time. He appears well-developed and well-nourished.  HENT:  Head: Normocephalic and atraumatic.  Eyes: Conjunctivae and EOM are normal.  Neck: Normal range of motion. Neck supple.  Cardiovascular: Normal rate, regular rhythm and normal heart sounds.   Pulmonary/Chest: Effort normal and breath sounds normal. No respiratory distress.  Abdominal: He exhibits no distension. There is no tenderness. There is no rebound and no guarding.  Musculoskeletal: Normal range of motion.  Tenderness in L flank musculature  Neurological: He is alert and oriented to person, place, and time.  Skin: Skin is warm and dry.  Vitals reviewed.   ED Course  Procedures (including critical care time) Labs Review Labs Reviewed  URINALYSIS, ROUTINE W REFLEX MICROSCOPIC    Imaging Review Dg Chest 2 View  08/18/2014   CLINICAL DATA:  Lower left-sided chest pain.  Shortness of breath.  EXAM: CHEST  2 VIEW  COMPARISON:  Chest radiograph 11/07/2013  FINDINGS: Stable cardiomegaly. Low lung volumes. Persistent elevation of the right hemidiaphragm. Pulmonary vascular redistribution. No large consolidative pulmonary opacities. No pleural effusion or pneumothorax. Lateral view limited secondary to overlying soft tissues.  IMPRESSION: Low lung volumes.  Cardiomegaly with  pulmonary vascular redistribution.   Electronically Signed   By: Lovey Newcomer M.D.   On: 08/18/2014 12:46     EKG Interpretation   Date/Time:  Saturday August 18 2014 11:07:50 EST Ventricular Rate:  84 PR Interval:  164 QRS Duration: 87 QT Interval:  360 QTC Calculation: 425 R Axis:   76 Text Interpretation:  Age not entered, assumed to be  24 years old for  purpose  of ECG interpretation Sinus rhythm Low voltage, precordial leads  rate has decreased since last tracing Confirmed by Debby Freiberg [id number])  on 08/18/2014 11:21:50 AM      MDM   Final diagnoses:  Left-sided chest wall pain    24 y.o. male with pertinent PMH of morbid obesity presents with L flank pain in context of going to the gym yesterday. Pain musculoskeletal by history; worsened by movement, palpation.  No urinary symptoms reported. On arrival vital signs and physical exam as above.  Pt has exact reproduction with palpation and movement.  UA without blood.  Doubt nephrolithiasis or acs.  DC home in stable condition to fu with PCP.  I have reviewed all laboratory and imaging studies if ordered as above  1. Left-sided chest wall pain         Debby Freiberg, MD 08/18/14 [phone]

## 2014-10-13 DIAGNOSIS — J069 Acute upper respiratory infection, unspecified: Secondary | ICD-10-CM | POA: Diagnosis not present

## 2014-12-04 DIAGNOSIS — R600 Localized edema: Secondary | ICD-10-CM | POA: Diagnosis not present

## 2014-12-04 DIAGNOSIS — E1165 Type 2 diabetes mellitus with hyperglycemia: Secondary | ICD-10-CM | POA: Diagnosis not present

## 2014-12-04 DIAGNOSIS — G4730 Sleep apnea, unspecified: Secondary | ICD-10-CM | POA: Diagnosis not present

## 2014-12-25 ENCOUNTER — Other Ambulatory Visit (INDEPENDENT_AMBULATORY_CARE_PROVIDER_SITE_OTHER): Payer: Self-pay

## 2014-12-25 DIAGNOSIS — Z6841 Body Mass Index (BMI) 40.0 and over, adult: Secondary | ICD-10-CM | POA: Diagnosis not present

## 2014-12-25 DIAGNOSIS — Z01818 Encounter for other preprocedural examination: Secondary | ICD-10-CM

## 2015-01-09 DIAGNOSIS — E1165 Type 2 diabetes mellitus with hyperglycemia: Secondary | ICD-10-CM | POA: Diagnosis not present

## 2015-01-09 DIAGNOSIS — R600 Localized edema: Secondary | ICD-10-CM | POA: Diagnosis not present

## 2015-01-17 ENCOUNTER — Ambulatory Visit (INDEPENDENT_AMBULATORY_CARE_PROVIDER_SITE_OTHER): Payer: Medicare Other | Admitting: Psychiatry

## 2015-02-05 DIAGNOSIS — R600 Localized edema: Secondary | ICD-10-CM | POA: Diagnosis not present

## 2015-02-05 DIAGNOSIS — E1165 Type 2 diabetes mellitus with hyperglycemia: Secondary | ICD-10-CM | POA: Diagnosis not present

## 2015-02-05 DIAGNOSIS — E119 Type 2 diabetes mellitus without complications: Secondary | ICD-10-CM | POA: Diagnosis not present

## 2015-02-14 ENCOUNTER — Encounter: Payer: Self-pay | Admitting: Dietician

## 2015-02-14 ENCOUNTER — Ambulatory Visit (HOSPITAL_COMMUNITY): Admission: RE | Admit: 2015-02-14 | Payer: Medicare Other | Source: Ambulatory Visit

## 2015-02-14 ENCOUNTER — Encounter: Payer: Medicare Other | Attending: Surgery | Admitting: Dietician

## 2015-02-14 ENCOUNTER — Ambulatory Visit (HOSPITAL_COMMUNITY)
Admission: RE | Admit: 2015-02-14 | Discharge: 2015-02-14 | Disposition: A | Discharge status: Home / Self Care | Payer: Medicare Other | Source: Ambulatory Visit | Attending: Surgery | Admitting: Surgery

## 2015-02-14 ENCOUNTER — Ambulatory Visit (HOSPITAL_COMMUNITY): Payer: Medicare Other

## 2015-02-14 ENCOUNTER — Encounter (HOSPITAL_COMMUNITY)
Admission: RE | Disposition: A | Discharge status: Home / Self Care | Payer: Medicare Other | Source: Ambulatory Visit | Attending: Surgery

## 2015-02-14 DIAGNOSIS — Z713 Dietary counseling and surveillance: Secondary | ICD-10-CM | POA: Insufficient documentation

## 2015-02-14 DIAGNOSIS — Z6841 Body Mass Index (BMI) 40.0 and over, adult: Secondary | ICD-10-CM | POA: Diagnosis not present

## 2015-02-14 DIAGNOSIS — E669 Obesity, unspecified: Secondary | ICD-10-CM | POA: Insufficient documentation

## 2015-02-14 HISTORY — PX: BREATH TEK H PYLORI: SHX5422

## 2015-02-14 SURGERY — BREATH TEST, FOR HELICOBACTER PYLORI

## 2015-02-14 NOTE — Progress Notes (Signed)
  Pre-Op Assessment Visit:  Pre-Operative RYGB Surgery  Medical Nutrition Therapy:  Appt start time: 4888   End time:  1205.  Patient was seen on 02/14/2015 for Pre-Operative Nutrition Assessment. Assessment and letter of approval faxed to [facility] Bariatric Surgery Program coordinator on 02/14/2015.   Preferred Learning Style:   No preference indicated   Learning Readiness:   Ready  Handouts given during visit include:  Pre-Op Goals Bariatric Surgery Protein Shakes   During the appointment today the following Pre-Op Goals were reviewed with the patient: Maintain or lose weight as instructed by your surgeon Make healthy food choices Begin to limit portion sizes Limited concentrated sugars and fried foods Keep fat/sugar in the single digits per serving on   food labels Practice CHEWING your food  (aim for 30 chews per bite or until applesauce consistency) Practice not drinking 15 minutes before, during, and 30 minutes after each meal/snack Avoid all carbonated beverages  Avoid/limit caffeinated beverages  Avoid all sugar-sweetened beverages Consume 3 meals per day; eat every 3-5 hours Make a list of non-food related activities Aim for 64-100 ounces of FLUID daily  Aim for at least 60-80 grams of PROTEIN daily Look for a liquid protein source that contain ?15 g protein and ?5 g carbohydrate  (ex: shakes, drinks, shots)  Patient-Centered Goals: Goals: Would like to move around more and be more comfortable walking, improve sleepiness, improve OSA and diabetes.  8 level of confidence/10 level of importance  Demonstrated degree of understanding via:  Teach Back  Teaching Method Utilized:  Visual Auditory Hands on  Barriers to learning/adherence to lifestyle change: none  Patient to call the [facility] to enroll in Pre-Op and Post-Op Nutrition Education when surgery date is scheduled.

## 2015-02-14 NOTE — Progress Notes (Signed)
   02/14/15 [place]  Referring MD Hassell Done  Time of Last PO Intake 2000  Baseline Breath At: 0729  Pranactin Given At: 5726  Post-Dose Breath At: 0750  Sample 1 4.4  Sample 2 3.0  Test Negative

## 2015-02-14 NOTE — Patient Instructions (Signed)

## 2015-02-15 ENCOUNTER — Encounter (HOSPITAL_COMMUNITY): Payer: Self-pay | Admitting: Surgery

## 2015-02-19 ENCOUNTER — Ambulatory Visit: Payer: Medicare Other | Admitting: Psychiatry

## 2015-02-21 ENCOUNTER — Other Ambulatory Visit: Payer: Self-pay

## 2015-02-21 ENCOUNTER — Ambulatory Visit
Admission: RE | Admit: 2015-02-21 | Discharge: 2015-02-21 | Disposition: A | Discharge status: Home / Self Care | Payer: Medicare Other | Source: Ambulatory Visit | Attending: General Surgery | Admitting: General Surgery

## 2015-02-21 ENCOUNTER — Other Ambulatory Visit: Payer: Self-pay | Admitting: General Surgery

## 2015-02-21 ENCOUNTER — Ambulatory Visit: Admission: RE | Admit: 2015-02-21 | Payer: Medicare Other | Source: Ambulatory Visit

## 2015-02-21 ENCOUNTER — Other Ambulatory Visit: Payer: Self-pay | Admitting: Surgery

## 2015-02-21 DIAGNOSIS — Z01818 Encounter for other preprocedural examination: Secondary | ICD-10-CM

## 2015-02-21 DIAGNOSIS — R918 Other nonspecific abnormal finding of lung field: Secondary | ICD-10-CM | POA: Diagnosis not present

## 2015-04-08 DIAGNOSIS — Z23 Encounter for immunization: Secondary | ICD-10-CM | POA: Diagnosis not present

## 2015-04-08 DIAGNOSIS — E1165 Type 2 diabetes mellitus with hyperglycemia: Secondary | ICD-10-CM | POA: Diagnosis not present

## 2015-05-06 DIAGNOSIS — R600 Localized edema: Secondary | ICD-10-CM | POA: Diagnosis not present

## 2015-05-06 DIAGNOSIS — E1165 Type 2 diabetes mellitus with hyperglycemia: Secondary | ICD-10-CM | POA: Diagnosis not present

## 2015-05-07 ENCOUNTER — Ambulatory Visit (INDEPENDENT_AMBULATORY_CARE_PROVIDER_SITE_OTHER): Payer: Medicare Other | Admitting: Psychiatry

## 2015-05-22 DIAGNOSIS — J069 Acute upper respiratory infection, unspecified: Secondary | ICD-10-CM | POA: Diagnosis not present

## 2015-06-04 DIAGNOSIS — E1165 Type 2 diabetes mellitus with hyperglycemia: Secondary | ICD-10-CM | POA: Diagnosis not present

## 2015-06-04 DIAGNOSIS — R600 Localized edema: Secondary | ICD-10-CM | POA: Diagnosis not present

## 2015-06-17 ENCOUNTER — Encounter: Payer: Medicare Other | Attending: Surgery

## 2015-06-17 DIAGNOSIS — Z713 Dietary counseling and surveillance: Secondary | ICD-10-CM | POA: Insufficient documentation

## 2015-06-17 DIAGNOSIS — Z6841 Body Mass Index (BMI) 40.0 and over, adult: Secondary | ICD-10-CM | POA: Insufficient documentation

## 2015-06-17 NOTE — Progress Notes (Signed)
  Pre-Operative Nutrition Class:  Appt start time: 830   End time:  930.  Patient was seen on 06/17/2015 for Pre-Operative Bariatric Surgery Education at the [facility].   Surgery date: 07/08/15 Surgery type: RYGB Start weight at [facility]: 567.5 lbs on 02/14/15 Weight today: 553 lbs  TANITA  BODY COMP RESULTS  06/17/15   BMI (kg/m^2) N/A   Fat Mass (lbs)    Fat Free Mass (lbs)    Total Body Water (lbs)    Samples given per MNT protocol. Patient educated on appropriate usage: Premier protein shake (vanilla - qty 1) Lot #: [id number] Exp: 05/2016  Unjury protein powder (unflavored - qty 1) Lot #: [id number] Exp: 02/2016  Celebrate Vitamins Multivitamin (orange - qty 1) Lot #: [id number] Exp: 10/2016  Celebrate Vitamins Calcium Citrate (berry - qty 1) Lot #: [id number] Exp: 10/2016  PB2 (chocolate - qty 1) Lot #: N/A Exp: 06/22/15  The following the learning objectives were met by the patient during this course:  Identify Pre-Op Dietary Goals and will begin 2 weeks pre-operatively  Identify appropriate sources of fluids and proteins   State protein recommendations and appropriate sources pre and post-operatively  Identify Post-Operative Dietary Goals and will follow for 2 weeks post-operatively  Identify appropriate multivitamin and calcium sources  Describe the need for physical activity post-operatively and will follow MD recommendations  State when to call healthcare provider regarding medication questions or post-operative complications  Handouts given during class include:  Pre-Op Bariatric Surgery Diet Handout  Protein Shake Handout  Post-Op Bariatric Surgery Nutrition Handout  BELT Program Information Flyer  Support Group Information Flyer  [facility] Bariatric Supplements Price List  Follow-Up Plan: Patient will follow-up at [facility] 2 weeks post operatively for diet advancement per MD.

## 2015-06-21 ENCOUNTER — Ambulatory Visit: Payer: Self-pay | Admitting: Surgery

## 2015-06-21 DIAGNOSIS — Z6841 Body Mass Index (BMI) 40.0 and over, adult: Secondary | ICD-10-CM | POA: Diagnosis not present

## 2015-06-21 NOTE — H&P (Signed)
Chief Complaint:  Super morbid obesity with BMI of 81  History of Present Illness:  Anthony Cox is an 24 y.o. male presents today for Roux en Y gastric bypass possible sleeve gastrectomy.  Anthony Cox was referred by Samara Snide with morbid obesity. When he would've been eligible to graduate from high school he had a BMI of 77 and now his BMI is up to 85.8. He lives with his mother and she is obese as well. Chanc has problems with diabetes and obstructive sleep apnea. Those are his 2 main problems. He does have secondary peripheral edema which has caused problems with cellulitis. He is very interested in a gastric bypass and I discussed that with him as well as talked to him about possibility of a sleeve gastrectomy. He's been through our seminar and is familiar with both procedures. He said it would be no problem to have the sleeve as a backup if we're unable to mobilize his intestines to what we could bypass to this pouch. His upper GI series showed no hiatal hernia.     Past Medical History  Diagnosis Date  . Blount's disease   . OSA (obstructive sleep apnea)     noncompliant with cpap  . Diabetes mellitus without complication ([place])   . Morbid obesity [employer])     Past Surgical History  Procedure Laterality Date  . Knee surgery    . Breath tek h pylori N/A 02/14/2015    Procedure: BREATH TEK H PYLORI;  Surgeon: Johnathan Hausen, MD;  Location: [facility];  Service: General;  Laterality: N/A;    Current Outpatient Prescriptions  Medication Sig Dispense Refill  . acetaminophen (TYLENOL) 325 MG tablet Take 650 mg by mouth every 6 (six) hours as needed for mild pain.    . furosemide (LASIX) 40 MG tablet     . metFORMIN (GLUCOPHAGE-XR) 500 MG 24 hr tablet Take 500 mg by mouth daily.     No current facility-administered medications for this visit.   Review of patient's allergies indicates no known allergies. Family History  Problem Relation Age of Onset  . Obesity    . Sarcoidosis  Mother   . Diabetes Maternal Grandfather   . Prostate cancer Maternal Grandfather   . Arthritis Maternal Grandmother   . Arthritis Mother    Social History:   reports that he has never smoked. He does not have any smokeless tobacco history on file. He reports that he drinks alcohol. He reports that he uses illicit drugs (Marijuana).   REVIEW OF SYSTEMS : Negative except for see problem list.  Childhood obesity  Physical Exam:   There were no vitals taken for this visit. There is no weight on file to calculate BMI. Wt 552 and BMI of 81.6  Gen:  WDWN AAM NAD  Neurological: Alert and oriented to person, place, and time. Motor and sensory function is grossly intact  Head: Normocephalic and atraumatic.  Eyes: Conjunctivae are normal. Pupils are equal, round, and reactive to light. No scleral icterus.  Neck: Normal range of motion. Neck supple. No tracheal deviation or thyromegaly present.  Cardiovascular:  SR without murmurs or gallops.  No carotid bruits Breast:  Not examined Respiratory: Effort normal.  No respiratory distress. No chest wall tenderness. Breath sounds normal.  No wheezes, rales or rhonchi.  Abdomen:  Obese without scars GU:  Not examined Musculoskeletal: Normal range of motion. Extremities are nontender. No cyanosis, edema or clubbing noted Lymphadenopathy: No cervical, preauricular, postauricular or axillary adenopathy is present Skin:  Skin is warm and dry. No rash noted. No diaphoresis. No erythema. No pallor. Pscyh: Normal mood and affect. Behavior is normal. Judgment and thought content normal.   LABORATORY RESULTS: No results found for this or any previous visit (from the past 48 hour(s)).   RADIOLOGY RESULTS: No results found.  Problem List: Patient Active Problem List   Diagnosis Date Noted  . Leukocytosis, unspecified 02/10/2013  . Morbid obesity ([place]) 02/10/2013  . Fever, unspecified 02/10/2013  . Headache(784.0) 02/10/2013  . Cellulitis of leg, left  02/10/2013  . Sepsis ([place]) 02/09/2013  . Acute respiratory failure ([place]) 02/09/2013  . SOB (shortness of breath) 02/09/2013  . Blount's disease 02/09/2013    Assessment & Plan: Super morbid obesity for roux en Y gastric bypass or sleeve gastrectomy if unable to perform bypass.      Matt B. Hassell Done, MD, [facility] [phone] beeper [phone]  06/21/2015 4:45 PM

## 2015-06-24 NOTE — Patient Instructions (Addendum)
Anthony Cox  06/24/2015   Your procedure is scheduled on: Monday July 08, 2015   Report to [facility] Main  Entrance take [place]  elevators to 3rd floor to  [place] at 05:20 AM.  Call this number if you have problems the morning of surgery [phone]   Remember: ONLY 1 PERSON MAY GO WITH YOU TO SHORT STAY TO GET  READY MORNING OF [place].  Do not eat food or drink liquids :After Midnight.SUNDAY NIGHT                BRING CPAP MASK AND TUBING DAY OF SURGERY   Take these medicines the morning of surgery with A SIP OF WATER: NONE DO NOT TAKE ANY DIABETIC MEDICATIONS DAY OF YOUR SURGERY                               You may not have any metal on your body including hair pins and              piercings  Do not wear jewelry,  lotions, powders or colognes, deodorant                           Men may shave face and neck.   Do not bring valuables to the hospital. Time.  Contacts, dentures or bridgework may not be worn into surgery.  Leave suitcase in the car. After surgery it may be brought to your room.       [place] - Preparing for [facility] - Preparing for Surgery Before surgery, you can play an important role.  Because skin is not sterile, your skin needs to be as free of germs as possible.  You can reduce the number of germs on your skin by washing with CHG (chlorahexidine gluconate) soap before surgery.  CHG is an antiseptic cleaner which kills germs and bonds with the skin to continue killing germs even after washing. Please DO NOT use if you have an allergy to CHG or antibacterial soaps.  If your skin becomes reddened/irritated stop using the CHG and inform your nurse when you arrive at Short Stay. Do not shave (including legs and underarms) for at least 48 hours prior to the first CHG shower.  You may shave your face/neck. Please follow these instructions carefully:  1.   Shower with CHG Soap the night before surgery and the  morning of Surgery.  2.  If you choose to wash your hair, wash your hair first as usual with your  normal  shampoo.  3.  After you shampoo, rinse your hair and body thoroughly to remove the  shampoo.                           4.  Use CHG as you would any other liquid soap.  You can apply chg directly  to the skin and wash                       Gently with a scrungie or clean washcloth.  5.  Apply the CHG Soap to your body ONLY FROM THE NECK DOWN.  Do not use on face/ open                           Wound or open sores. Avoid contact with eyes, ears mouth and genitals (private parts).                       Wash face,  Genitals (private parts) with your normal soap.             6.  Wash thoroughly, paying special attention to the area where your surgery  will be performed.  7.  Thoroughly rinse your body with warm water from the neck down.  8.  DO NOT shower/wash with your normal soap after using and rinsing off  the CHG Soap.                9.  Pat yourself dry with a clean towel.            10.  Wear clean pajamas.            11.  Place clean sheets on your bed the night of your first shower and do not  sleep with pets. Day of Surgery : Do not apply any lotions/deodorants the morning of surgery.  Please wear clean clothes to the hospital/surgery center.  FAILURE TO FOLLOW THESE INSTRUCTIONS MAY RESULT IN THE CANCELLATION OF YOUR SURGERY PATIENT SIGNATURE_________________________________  NURSE SIGNATURE__________________________________  ________________________________________________________________________

## 2015-06-24 NOTE — Progress Notes (Signed)
Chest x ray EKG, both 7/16 EPIC

## 2015-06-25 ENCOUNTER — Encounter (HOSPITAL_COMMUNITY): Payer: Self-pay

## 2015-06-25 ENCOUNTER — Encounter (HOSPITAL_COMMUNITY)
Admission: RE | Admit: 2015-06-25 | Discharge: 2015-06-25 | Disposition: A | Discharge status: Home / Self Care | Payer: Medicare Other | Source: Ambulatory Visit | Attending: Surgery | Admitting: Surgery

## 2015-06-25 DIAGNOSIS — Z01812 Encounter for preprocedural laboratory examination: Secondary | ICD-10-CM | POA: Diagnosis not present

## 2015-06-25 HISTORY — DX: Headache, unspecified: R51.9

## 2015-06-25 HISTORY — DX: Localized edema: R60.0

## 2015-06-25 HISTORY — DX: Reserved for inherently not codable concepts without codable children: IMO0001

## 2015-06-25 HISTORY — DX: Personal history of other diseases of the respiratory system: Z87.09

## 2015-06-25 HISTORY — DX: Gastro-esophageal reflux disease without esophagitis: K21.9

## 2015-06-25 HISTORY — DX: Headache: R51

## 2015-06-25 HISTORY — DX: Personal history of diseases of the skin and subcutaneous tissue: Z87.2

## 2015-06-25 HISTORY — DX: Anemia, unspecified: D64.9

## 2015-06-25 LAB — COMPREHENSIVE METABOLIC PANEL
ALT: 23 U/L (ref 17–63)
AST: 22 U/L (ref 15–41)
Albumin: 4.1 g/dL (ref 3.5–5.0)
Alkaline Phosphatase: 47 U/L (ref 38–126)
Anion gap: 8 (ref 5–15)
BUN: 12 mg/dL (ref 6–20)
CO2: 29 mmol/L (ref 22–32)
Calcium: 9.4 mg/dL (ref 8.9–10.3)
Chloride: 102 mmol/L (ref 101–111)
Creatinine, Ser: 0.67 mg/dL (ref 0.61–1.24)
GFR calc Af Amer: >60 mL/min (ref >60)
GFR calc non Af Amer: >60 mL/min (ref >60)
Glucose, Bld: 164 mg/dL — ABNORMAL HIGH (ref 65–99)
Potassium: 4 mmol/L (ref 3.5–5.1)
Sodium: 139 mmol/L (ref 135–145)
Total Bilirubin: 1 mg/dL (ref 0.3–1.2)
Total Protein: 7.9 g/dL (ref 6.5–8.1)

## 2015-06-25 LAB — CBC WITH DIFFERENTIAL/PLATELET
Basophils Absolute: 0 10*3/uL (ref 0.0–0.1)
Basophils Relative: 0 %
Eosinophils Absolute: 0.1 10*3/uL (ref 0.0–0.7)
Eosinophils Relative: 1 %
HCT: 42.5 % (ref 39.0–52.0)
Hemoglobin: 14.2 g/dL (ref 13.0–17.0)
Lymphocytes Relative: 38 %
Lymphs Abs: 2.3 10*3/uL (ref 0.7–4.0)
MCH: 26.2 pg (ref 26.0–34.0)
MCHC: 33.4 g/dL (ref 30.0–36.0)
MCV: 78.4 fL (ref 78.0–100.0)
Monocytes Absolute: 0.5 10*3/uL (ref 0.1–1.0)
Monocytes Relative: 8 %
Neutro Abs: 3.2 10*3/uL (ref 1.7–7.7)
Neutrophils Relative %: 53 %
Platelets: 254 10*3/uL (ref 150–400)
RBC: 5.42 MIL/uL (ref 4.22–5.81)
RDW: 15.2 % (ref 11.5–15.5)
WBC: 6.1 10*3/uL (ref 4.0–10.5)

## 2015-06-25 NOTE — Progress Notes (Signed)
Dr Constance Goltz in to see pt. No orders given. Anesthesia to see pt day of surgery.

## 2015-06-25 NOTE — Progress Notes (Signed)
Spoke with Montine Circle in Software [employer] in regards to order for Bariatric Bed and Programme researcher, broadcasting/film/video. MRN number given, pt name and surgery date.

## 2015-06-25 NOTE — Progress Notes (Signed)
Anesthesia H&P for Pre-surgical Evaluation:  [name] is a 24yo AAM with PMH significant for DMII on oral medications, OSA with intermittent CPAP use, super morbid obesity with BMI of 79 who presents for pre-op evaluation for a Roux-en-y gastric bypass on 07/08/15.  Patient states his diabetes is well controlled on metformin.  States he has received anesthesia in the past with no complications.  ROS: CV: denies CP, SOB, HTN. Mets<4 Pulm: +OSA, denies asthma, COPD GI: denies GERD Neuro: denies history of CVA, seizures Endo: BMI of 79; DMII on oral medications Heme: denies anemia MSK: denies arthritis  PE: Gen: obese gentleman in no acute distress HEENT: Mallampati III airway, no teeth loose, missing, chipped, full ROM in neck, TMD >3FB CV: RRR no murmurs Pulm: CTAB  Labs: reviewed, EKG with NSR  A/P: 23yo AAM with super morbid obesity, DMII, OSA who presents for evaluation prior to Roux-en-y gastric bypass. -OK to proceed with surgery per Anesthesia -Anticipate difficult airway due to body habitus, have backup techniques available.  Hoy Morn, MD Anesthesiology

## 2015-07-06 NOTE — Anesthesia Preprocedure Evaluation (Addendum)
Anesthesia Evaluation  Patient identified by MRN, date of birth, ID band Patient awake    Reviewed: Allergy & Precautions, NPO status , Patient's Chart, lab work & pertinent test results  History of Anesthesia Complications Negative for: history of anesthetic complications  Airway Mallampati: III  TM Distance: >3 FB Neck ROM: Full    Dental  (+) Teeth Intact, Dental Advisory Given   Pulmonary sleep apnea ,    Pulmonary exam normal        Cardiovascular negative cardio ROS Normal cardiovascular exam     Neuro/Psych  Headaches, negative psych ROS   GI/Hepatic Neg liver ROS, GERD  ,Patient received Oral Contrast Agents,  Endo/Other  diabetes  Renal/GU negative Renal ROS     Musculoskeletal   Abdominal   Peds  Hematology   Anesthesia Other Findings   Reproductive/Obstetrics                            Anesthesia Physical Anesthesia Plan  ASA: III  Anesthesia Plan: General   Post-op Pain Management:    Induction: Intravenous  Airway Management Planned: Oral ETT  Additional Equipment:   Intra-op Plan:   Post-operative Plan: Possible Post-op intubation/ventilation  Informed Consent: I have reviewed the patients History and Physical, chart, labs and discussed the procedure including the risks, benefits and alternatives for the proposed anesthesia with the patient or authorized representative who has indicated his/her understanding and acceptance.   Dental advisory given  Plan Discussed with: CRNA, Anesthesiologist and Surgeon  Anesthesia Plan Comments:        Anesthesia Quick Evaluation

## 2015-07-08 ENCOUNTER — Inpatient Hospital Stay (HOSPITAL_COMMUNITY): Payer: Medicare Other | Admitting: Anesthesiology

## 2015-07-08 ENCOUNTER — Encounter (HOSPITAL_COMMUNITY)
Admission: RE | Disposition: A | Discharge status: Home / Self Care | Payer: Self-pay | Source: Ambulatory Visit | Attending: Surgery

## 2015-07-08 ENCOUNTER — Encounter (HOSPITAL_COMMUNITY): Payer: Self-pay

## 2015-07-08 ENCOUNTER — Inpatient Hospital Stay (HOSPITAL_COMMUNITY)
Admission: RE | Admit: 2015-07-08 | Discharge: 2015-07-10 | DRG: 621 | Disposition: A | Discharge status: Home / Self Care | Payer: Medicare Other | Source: Ambulatory Visit | Attending: Surgery | Admitting: Surgery

## 2015-07-08 DIAGNOSIS — F1290 Cannabis use, unspecified, uncomplicated: Secondary | ICD-10-CM | POA: Diagnosis present

## 2015-07-08 DIAGNOSIS — E119 Type 2 diabetes mellitus without complications: Secondary | ICD-10-CM | POA: Diagnosis not present

## 2015-07-08 DIAGNOSIS — G4733 Obstructive sleep apnea (adult) (pediatric): Secondary | ICD-10-CM | POA: Diagnosis present

## 2015-07-08 DIAGNOSIS — K219 Gastro-esophageal reflux disease without esophagitis: Secondary | ICD-10-CM | POA: Diagnosis not present

## 2015-07-08 DIAGNOSIS — Z9889 Other specified postprocedural states: Secondary | ICD-10-CM | POA: Diagnosis not present

## 2015-07-08 DIAGNOSIS — Z9884 Bariatric surgery status: Secondary | ICD-10-CM

## 2015-07-08 DIAGNOSIS — Z9119 Patient's noncompliance with other medical treatment and regimen: Secondary | ICD-10-CM

## 2015-07-08 DIAGNOSIS — Z6841 Body Mass Index (BMI) 40.0 and over, adult: Secondary | ICD-10-CM | POA: Diagnosis not present

## 2015-07-08 DIAGNOSIS — Z7984 Long term (current) use of oral hypoglycemic drugs: Secondary | ICD-10-CM | POA: Diagnosis not present

## 2015-07-08 HISTORY — PX: LAPAROSCOPIC GASTRIC SLEEVE RESECTION: SHX5895

## 2015-07-08 HISTORY — PX: UPPER GI ENDOSCOPY: SHX6162

## 2015-07-08 LAB — CBC
HCT: 44.3 % (ref 39.0–52.0)
Hemoglobin: 14.4 g/dL (ref 13.0–17.0)
MCH: 26 pg (ref 26.0–34.0)
MCHC: 32.5 g/dL (ref 30.0–36.0)
MCV: 80.1 fL (ref 78.0–100.0)
Platelets: 260 10*3/uL (ref 150–400)
RBC: 5.53 MIL/uL (ref 4.22–5.81)
RDW: 15.2 % (ref 11.5–15.5)
WBC: 11.6 10*3/uL — ABNORMAL HIGH (ref 4.0–10.5)

## 2015-07-08 LAB — GLUCOSE, CAPILLARY
Glucose-Capillary: 117 mg/dL — ABNORMAL HIGH (ref 65–99)
Glucose-Capillary: 227 mg/dL — ABNORMAL HIGH (ref 65–99)

## 2015-07-08 LAB — MRSA PCR SCREENING: MRSA by PCR: NEGATIVE

## 2015-07-08 LAB — CREATININE, SERUM
Creatinine, Ser: 0.92 mg/dL (ref 0.61–1.24)
GFR calc Af Amer: >60 mL/min (ref >60)
GFR calc non Af Amer: >60 mL/min (ref >60)

## 2015-07-08 SURGERY — GASTRECTOMY, SLEEVE, LAPAROSCOPIC
Anesthesia: General | Site: Abdomen

## 2015-07-08 MED ORDER — SUGAMMADEX SODIUM 500 MG/5ML IV SOLN
INTRAVENOUS | Status: AC
  Filled 2015-07-08: qty 5

## 2015-07-08 MED ORDER — CHLORHEXIDINE GLUCONATE CLOTH 2 % EX PADS: 6.0000 | MEDICATED_PAD | Freq: Once | CUTANEOUS | Status: DC

## 2015-07-08 MED ORDER — HEPARIN SODIUM (PORCINE) 5000 UNIT/ML IJ SOLN
5000.0000 [IU] | INTRAMUSCULAR | Status: AC
  Administered 2015-07-08: 5000 [IU] via SUBCUTANEOUS
  Filled 2015-07-08: qty 1

## 2015-07-08 MED ORDER — LIDOCAINE HCL (CARDIAC) 20 MG/ML IV SOLN
INTRAVENOUS | Status: DC | PRN
  Administered 2015-07-08: 100 mg via INTRAVENOUS

## 2015-07-08 MED ORDER — MORPHINE SULFATE (PF) 2 MG/ML IV SOLN
2.0000 mg | INTRAVENOUS | Status: DC | PRN
  Administered 2015-07-08 – 2015-07-09 (×4): 2 mg via INTRAVENOUS
  Filled 2015-07-08 (×4): qty 1

## 2015-07-08 MED ORDER — LACTATED RINGERS IR SOLN
Status: DC | PRN
  Administered 2015-07-08: 3000 mL

## 2015-07-08 MED ORDER — HYDROMORPHONE HCL 1 MG/ML IJ SOLN
0.2500 mg | INTRAMUSCULAR | Status: DC | PRN
  Administered 2015-07-08 (×2): 0.5 mg via INTRAVENOUS

## 2015-07-08 MED ORDER — MIDAZOLAM HCL 2 MG/2ML IJ SOLN
INTRAMUSCULAR | Status: AC
  Filled 2015-07-08: qty 2

## 2015-07-08 MED ORDER — ROCURONIUM BROMIDE 100 MG/10ML IV SOLN
INTRAVENOUS | Status: AC
  Filled 2015-07-08: qty 1

## 2015-07-08 MED ORDER — MIDAZOLAM HCL 5 MG/5ML IJ SOLN
INTRAMUSCULAR | Status: DC | PRN
  Administered 2015-07-08 (×2): 1 mg via INTRAVENOUS

## 2015-07-08 MED ORDER — ONDANSETRON HCL 4 MG/2ML IJ SOLN: 4.0000 mg | INTRAMUSCULAR | Status: DC | PRN

## 2015-07-08 MED ORDER — DEXTROSE 5 % IV SOLN
INTRAVENOUS | Status: AC
  Filled 2015-07-08: qty 2

## 2015-07-08 MED ORDER — FENTANYL CITRATE (PF) 250 MCG/5ML IJ SOLN
INTRAMUSCULAR | Status: AC
  Filled 2015-07-08: qty 5

## 2015-07-08 MED ORDER — PREMIER PROTEIN SHAKE
2.0000 [oz_av] | Freq: Four times a day (QID) | ORAL | Status: DC
  Administered 2015-07-10 (×3): 2 [oz_av] via ORAL

## 2015-07-08 MED ORDER — PROMETHAZINE HCL 25 MG/ML IJ SOLN: 6.2500 mg | INTRAMUSCULAR | Status: DC | PRN

## 2015-07-08 MED ORDER — TISSEEL VH 10 ML EX KIT
PACK | CUTANEOUS | Status: AC
  Filled 2015-07-08: qty 2

## 2015-07-08 MED ORDER — FENTANYL CITRATE (PF) 100 MCG/2ML IJ SOLN
INTRAMUSCULAR | Status: DC | PRN
  Administered 2015-07-08: 250 ug via INTRAVENOUS
  Administered 2015-07-08 (×5): 50 ug via INTRAVENOUS

## 2015-07-08 MED ORDER — CEFOXITIN SODIUM 2 G IV SOLR
2.0000 g | INTRAVENOUS | Status: AC
  Administered 2015-07-08 (×2): 2 g via INTRAVENOUS

## 2015-07-08 MED ORDER — HYDROMORPHONE HCL 1 MG/ML IJ SOLN
INTRAMUSCULAR | Status: DC | PRN
  Administered 2015-07-08 (×2): 1 mg via INTRAVENOUS

## 2015-07-08 MED ORDER — PROPOFOL 10 MG/ML IV BOLUS
INTRAVENOUS | Status: DC | PRN
  Administered 2015-07-08: 300 mg via INTRAVENOUS

## 2015-07-08 MED ORDER — HEPARIN SODIUM (PORCINE) 5000 UNIT/ML IJ SOLN
5000.0000 [IU] | Freq: Three times a day (TID) | INTRAMUSCULAR | Status: DC
  Administered 2015-07-08 – 2015-07-10 (×6): 5000 [IU] via SUBCUTANEOUS
  Filled 2015-07-08 (×9): qty 1

## 2015-07-08 MED ORDER — HYDROMORPHONE HCL 1 MG/ML IJ SOLN
INTRAMUSCULAR | Status: AC
  Filled 2015-07-08: qty 1

## 2015-07-08 MED ORDER — KCL IN DEXTROSE-NACL 20-5-0.45 MEQ/L-%-% IV SOLN
INTRAVENOUS | Status: DC
  Administered 2015-07-08 – 2015-07-09 (×2): via INTRAVENOUS
  Administered 2015-07-09: 1000 mL via INTRAVENOUS
  Administered 2015-07-10: 01:00:00 via INTRAVENOUS
  Filled 2015-07-08 (×6): qty 1000

## 2015-07-08 MED ORDER — LIDOCAINE HCL (CARDIAC) 20 MG/ML IV SOLN
INTRAVENOUS | Status: AC
  Filled 2015-07-08: qty 5

## 2015-07-08 MED ORDER — OXYCODONE HCL 5 MG/5ML PO SOLN
5.0000 mg | ORAL | Status: DC | PRN
  Administered 2015-07-09: 10 mg via ORAL
  Filled 2015-07-08: qty 10

## 2015-07-08 MED ORDER — ONDANSETRON HCL 4 MG/2ML IJ SOLN
INTRAMUSCULAR | Status: DC | PRN
  Administered 2015-07-08: 4 mg via INTRAVENOUS

## 2015-07-08 MED ORDER — SUGAMMADEX SODIUM 500 MG/5ML IV SOLN
INTRAVENOUS | Status: DC | PRN
  Administered 2015-07-08: 500 mg via INTRAVENOUS

## 2015-07-08 MED ORDER — DEXAMETHASONE SODIUM PHOSPHATE 10 MG/ML IJ SOLN
INTRAMUSCULAR | Status: AC
  Filled 2015-07-08: qty 1

## 2015-07-08 MED ORDER — ROCURONIUM BROMIDE 100 MG/10ML IV SOLN
INTRAVENOUS | Status: DC | PRN
  Administered 2015-07-08: 20 mg via INTRAVENOUS
  Administered 2015-07-08: 10 mg via INTRAVENOUS
  Administered 2015-07-08: 60 mg via INTRAVENOUS
  Administered 2015-07-08: 10 mg via INTRAVENOUS
  Administered 2015-07-08: 40 mg via INTRAVENOUS

## 2015-07-08 MED ORDER — ACETAMINOPHEN 160 MG/5ML PO SOLN: 650.0000 mg | ORAL | Status: DC | PRN

## 2015-07-08 MED ORDER — LACTATED RINGERS IV SOLN
INTRAVENOUS | Status: DC | PRN
  Administered 2015-07-08 (×3): via INTRAVENOUS

## 2015-07-08 MED ORDER — DEXAMETHASONE SODIUM PHOSPHATE 10 MG/ML IJ SOLN
INTRAMUSCULAR | Status: DC | PRN
  Administered 2015-07-08: 10 mg via INTRAVENOUS

## 2015-07-08 MED ORDER — CETYLPYRIDINIUM CHLORIDE 0.05 % MT LIQD
7.0000 mL | Freq: Two times a day (BID) | OROMUCOSAL | Status: DC
  Administered 2015-07-09 – 2015-07-10 (×3): 7 mL via OROMUCOSAL

## 2015-07-08 MED ORDER — SODIUM CHLORIDE 0.9 % IJ SOLN
INTRAMUSCULAR | Status: AC
  Filled 2015-07-08: qty 20

## 2015-07-08 MED ORDER — PROPOFOL 10 MG/ML IV BOLUS
INTRAVENOUS | Status: AC
  Filled 2015-07-08: qty 20

## 2015-07-08 MED ORDER — HYDROMORPHONE HCL 2 MG/ML IJ SOLN
INTRAMUSCULAR | Status: AC
  Filled 2015-07-08: qty 1

## 2015-07-08 MED ORDER — ACETAMINOPHEN 160 MG/5ML PO SOLN: 325.0000 mg | ORAL | Status: DC | PRN

## 2015-07-08 MED ORDER — BUPIVACAINE LIPOSOME 1.3 % IJ SUSP
Freq: Once | INTRAMUSCULAR | Status: DC
  Filled 2015-07-08 (×3): qty 20

## 2015-07-08 MED ORDER — LACTATED RINGERS IV SOLN: INTRAVENOUS | Status: DC

## 2015-07-08 MED ORDER — SUCCINYLCHOLINE CHLORIDE 20 MG/ML IJ SOLN
INTRAMUSCULAR | Status: DC | PRN
  Administered 2015-07-08: 100 mg via INTRAVENOUS

## 2015-07-08 MED ORDER — PANTOPRAZOLE SODIUM 40 MG IV SOLR
40.0000 mg | Freq: Every day | INTRAVENOUS | Status: DC
  Administered 2015-07-08 – 2015-07-09 (×2): 40 mg via INTRAVENOUS
  Filled 2015-07-08 (×3): qty 40

## 2015-07-08 MED ORDER — ONDANSETRON HCL 4 MG/2ML IJ SOLN
INTRAMUSCULAR | Status: AC
  Filled 2015-07-08: qty 2

## 2015-07-08 MED ORDER — 0.9 % SODIUM CHLORIDE (POUR BTL) OPTIME
TOPICAL | Status: DC | PRN
  Administered 2015-07-08: 1000 mL

## 2015-07-08 SURGICAL SUPPLY — 79 items
APL SKNCLS STERI-STRIP NONHPOA (GAUZE/BANDAGES/DRESSINGS)
APL SRG 32X5 SNPLK LF DISP (MISCELLANEOUS) ×1
APPLICATOR COTTON TIP 6IN STRL (MISCELLANEOUS) ×6 IMPLANT
APPLIER CLIP ROT 10 11.4 M/L (STAPLE)
APPLIER CLIP ROT 13.4 12 LRG (CLIP)
APR CLP LRG 13.4X12 ROT 20 MLT (CLIP)
APR CLP MED LRG 11.4X10 (STAPLE)
BENZOIN TINCTURE PRP APPL 2/3 (GAUZE/BANDAGES/DRESSINGS) IMPLANT
BLADE SURG 15 STRL LF DISP TIS (BLADE) ×2 IMPLANT
BLADE SURG 15 STRL SS (BLADE) ×3
CABLE HIGH FREQUENCY MONO STRZ (ELECTRODE) IMPLANT
CLIP APPLIE ROT 10 11.4 M/L (STAPLE) IMPLANT
CLIP APPLIE ROT 13.4 12 LRG (CLIP) IMPLANT
CLIP SUT LAPRA TY ABSORB (SUTURE) IMPLANT
COVER SURGICAL LIGHT HANDLE (MISCELLANEOUS) ×3 IMPLANT
DEVICE SUT QUICK LOAD TK 5 (STAPLE) IMPLANT
DEVICE SUT TI-KNOT TK 5X26 (MISCELLANEOUS) IMPLANT
DEVICE SUTURE ENDOST 10MM (ENDOMECHANICALS) IMPLANT
DEVICE TROCAR PUNCTURE CLOSURE (ENDOMECHANICALS) ×3 IMPLANT
DISSECTOR BLUNT TIP ENDO 5MM (MISCELLANEOUS) IMPLANT
DRAIN PENROSE 18X1/4 LTX STRL (WOUND CARE) ×3 IMPLANT
DRAPE CAMERA CLOSED 9X96 (DRAPES) ×3 IMPLANT
GAUZE SPONGE 4X4 12PLY STRL (GAUZE/BANDAGES/DRESSINGS) ×3 IMPLANT
GAUZE SPONGE 4X4 16PLY XRAY LF (GAUZE/BANDAGES/DRESSINGS) ×3 IMPLANT
GLOVE BIOGEL M 8.0 STRL (GLOVE) ×3 IMPLANT
GOWN STRL REUS W/TWL XL LVL3 (GOWN DISPOSABLE) ×12 IMPLANT
HANDLE STAPLE EGIA 4 XL (STAPLE) ×3 IMPLANT
HOVERMATT SINGLE USE (MISCELLANEOUS) ×3 IMPLANT
KIT BASIN OR (CUSTOM PROCEDURE TRAY) ×3 IMPLANT
KIT GASTRIC LAVAGE 34FR ADT (SET/KITS/TRAYS/PACK) IMPLANT
KIT PREVENA INCISION MGT 13 (CANNISTER) ×3 IMPLANT
KIT PREVENA INCISION MGT20CM45 (CANNISTER) ×3 IMPLANT
NEEDLE SPNL 22GX3.5 QUINCKE BK (NEEDLE) ×3 IMPLANT
PACK CARDIOVASCULAR III (CUSTOM PROCEDURE TRAY) ×3 IMPLANT
PEN SKIN MARKING BROAD (MISCELLANEOUS) ×3 IMPLANT
POUCH ENDO CATCH II 15MM (MISCELLANEOUS) ×3 IMPLANT
RELOAD EGIA 45 MED/THCK PURPLE (STAPLE) IMPLANT
RELOAD EGIA 45 TAN VASC (STAPLE) IMPLANT
RELOAD EGIA 60 MED/THCK PURPLE (STAPLE) IMPLANT
RELOAD EGIA 60 TAN VASC (STAPLE) IMPLANT
RELOAD ENDO STITCH 2.0 (ENDOMECHANICALS)
RELOAD TRI 45 ART MED THCK BLK (STAPLE) ×6 IMPLANT
RELOAD TRI 45 ART MED THCK PUR (STAPLE) IMPLANT
RELOAD TRI 60 ART MED THCK BLK (STAPLE) ×12 IMPLANT
RELOAD TRI 60 ART MED THCK PUR (STAPLE) IMPLANT
SCISSORS LAP 5X45 EPIX DISP (ENDOMECHANICALS) ×3 IMPLANT
SCRUB PCMX 4 OZ (MISCELLANEOUS) ×3 IMPLANT
SEALANT SURGICAL APPL DUAL CAN (MISCELLANEOUS) ×3 IMPLANT
SET IRRIG TUBING LAPAROSCOPIC (IRRIGATION / IRRIGATOR) ×3 IMPLANT
SHEARS HARMONIC ACE PLUS 45CM (MISCELLANEOUS) ×3 IMPLANT
SLEEVE ADV FIXATION 12X100MM (TROCAR) ×6 IMPLANT
SLEEVE ADV FIXATION 5X100MM (TROCAR) IMPLANT
SOLUTION ANTI FOG 6CC (MISCELLANEOUS) ×3 IMPLANT
STAPLER VISISTAT 35W (STAPLE) ×3 IMPLANT
STRIP CLOSURE SKIN 1/2X4 (GAUZE/BANDAGES/DRESSINGS) IMPLANT
SUT RELOAD ENDO STITCH 2 48X1 (ENDOMECHANICALS)
SUT RELOAD ENDO STITCH 2.0 (ENDOMECHANICALS)
SUT SURGIDAC NAB ES-9 0 48 120 (SUTURE) IMPLANT
SUT VIC AB 2-0 SH 27 (SUTURE)
SUT VIC AB 2-0 SH 27X BRD (SUTURE) IMPLANT
SUT VIC AB 4-0 SH 18 (SUTURE) ×3 IMPLANT
SUTURE RELOAD END STTCH 2 48X1 (ENDOMECHANICALS) IMPLANT
SUTURE RELOAD ENDO STITCH 2.0 (ENDOMECHANICALS) IMPLANT
SYR 10ML ECCENTRIC (SYRINGE) ×3 IMPLANT
SYR 20CC LL (SYRINGE) ×6 IMPLANT
SYR 50ML LL SCALE MARK (SYRINGE) ×3 IMPLANT
TOWEL OR 17X26 10 PK STRL BLUE (TOWEL DISPOSABLE) ×6 IMPLANT
TOWEL OR NON WOVEN STRL DISP B (DISPOSABLE) ×3 IMPLANT
TRAY FOLEY W/METER SILVER 14FR (SET/KITS/TRAYS/PACK) IMPLANT
TRAY FOLEY W/METER SILVER 16FR (SET/KITS/TRAYS/PACK) ×3 IMPLANT
TROCAR 12M 150ML BLUNT (TROCAR) ×9 IMPLANT
TROCAR ADV FIXATION 12X100MM (TROCAR) ×3 IMPLANT
TROCAR ADV FIXATION 5X100MM (TROCAR) ×3 IMPLANT
TROCAR BLADELESS OPT 5 100 (ENDOMECHANICALS) IMPLANT
TROCAR BLADELESS OPT 5 150 (ENDOMECHANICALS) ×6 IMPLANT
TROCAR XCEL 12X100 BLDLESS (ENDOMECHANICALS) ×3 IMPLANT
TUBING CONNECTING 10 (TUBING) ×3 IMPLANT
TUBING ENDO SMARTCAP PENTAX (MISCELLANEOUS) ×3 IMPLANT
TUBING FILTER THERMOFLATOR (ELECTROSURGICAL) ×3 IMPLANT

## 2015-07-08 NOTE — Anesthesia Postprocedure Evaluation (Signed)
Anesthesia Post Note  Patient: Anthony Cox  Procedure(s) Performed: Procedure(s) (LRB): LAPAROSCOPIC GASTRIC SLEEVE RESECTION (N/A) UPPER GI ENDOSCOPY (N/A)  Patient location during evaluation: PACU Anesthesia Type: General Level of consciousness: sedated Pain management: pain level controlled Vital Signs Assessment: post-procedure vital signs reviewed and stable Respiratory status: spontaneous breathing and respiratory function stable Cardiovascular status: stable Anesthetic complications: no    Last Vitals:  Filed Vitals:   07/08/15 1130 07/08/15 1145  BP: 176/97 146/77  Pulse: 111 107  Temp:  36.6 C  Resp: 20 26    Last Pain:  Filed Vitals:   07/08/15 1157  PainSc: [name]

## 2015-07-08 NOTE — Progress Notes (Signed)
RT placed patient on CPAP. Patient setting is 14 to 20 auto titrate for patient comfort. Sterile water added to water chamber for humidification. 5 liters oxygen bleed into tubing. Patient is tolerating well. RT will continue to monitor.

## 2015-07-08 NOTE — H&P (View-Only) (Signed)
Chief Complaint:  Super morbid obesity with BMI of 81  History of Present Illness:  Anthony Cox is an 24 y.o. male presents today for Roux en Y gastric bypass possible sleeve gastrectomy.  Anthony Cox was referred by Samara Snide with morbid obesity. When he would've been eligible to graduate from high school he had a BMI of 77 and now his BMI is up to 85.8. He lives with his mother and she is obese as well. Anthony Cox has problems with diabetes and obstructive sleep apnea. Those are his 2 main problems. He does have secondary peripheral edema which has caused problems with cellulitis. He is very interested in a gastric bypass and I discussed that with him as well as talked to him about possibility of a sleeve gastrectomy. He's been through our seminar and is familiar with both procedures. He said it would be no problem to have the sleeve as a backup if we're unable to mobilize his intestines to what we could bypass to this pouch. His upper GI series showed no hiatal hernia.     Past Medical History  Diagnosis Date  . Blount's disease   . OSA (obstructive sleep apnea)     noncompliant with cpap  . Diabetes mellitus without complication ([place])   . Morbid obesity [facility])     Past Surgical History  Procedure Laterality Date  . Knee surgery    . Breath tek h pylori N/A 02/14/2015    Procedure: BREATH TEK H PYLORI;  Surgeon: Johnathan Hausen, MD;  Location: [facility];  Service: General;  Laterality: N/A;    Current Outpatient Prescriptions  Medication Sig Dispense Refill  . acetaminophen (TYLENOL) 325 MG tablet Take 650 mg by mouth every 6 (six) hours as needed for mild pain.    . furosemide (LASIX) 40 MG tablet     . metFORMIN (GLUCOPHAGE-XR) 500 MG 24 hr tablet Take 500 mg by mouth daily.     No current facility-administered medications for this visit.   Review of patient's allergies indicates no known allergies. Family History  Problem Relation Age of Onset  . Obesity    . Sarcoidosis  Mother   . Diabetes Maternal Grandfather   . Prostate cancer Maternal Grandfather   . Arthritis Maternal Grandmother   . Arthritis Mother    Social History:   reports that he has never smoked. He does not have any smokeless tobacco history on file. He reports that he drinks alcohol. He reports that he uses illicit drugs (Marijuana).   REVIEW OF SYSTEMS : Negative except for see problem list.  Childhood obesity  Physical Exam:   There were no vitals taken for this visit. There is no weight on file to calculate BMI. Wt 552 and BMI of 81.6  Gen:  WDWN AAM NAD  Neurological: Alert and oriented to person, place, and time. Motor and sensory function is grossly intact  Head: Normocephalic and atraumatic.  Eyes: Conjunctivae are normal. Pupils are equal, round, and reactive to light. No scleral icterus.  Neck: Normal range of motion. Neck supple. No tracheal deviation or thyromegaly present.  Cardiovascular:  SR without murmurs or gallops.  No carotid bruits Breast:  Not examined Respiratory: Effort normal.  No respiratory distress. No chest wall tenderness. Breath sounds normal.  No wheezes, rales or rhonchi.  Abdomen:  Obese without scars GU:  Not examined Musculoskeletal: Normal range of motion. Extremities are nontender. No cyanosis, edema or clubbing noted Lymphadenopathy: No cervical, preauricular, postauricular or axillary adenopathy is present Skin:  Skin is warm and dry. No rash noted. No diaphoresis. No erythema. No pallor. Pscyh: Normal mood and affect. Behavior is normal. Judgment and thought content normal.   LABORATORY RESULTS: No results found for this or any previous visit (from the past 48 hour(s)).   RADIOLOGY RESULTS: No results found.  Problem List: Patient Active Problem List   Diagnosis Date Noted  . Leukocytosis, unspecified 02/10/2013  . Morbid obesity ([place]) 02/10/2013  . Fever, unspecified 02/10/2013  . Headache(784.0) 02/10/2013  . Cellulitis of leg, left  02/10/2013  . Sepsis ([place]) 02/09/2013  . Acute respiratory failure ([place]) 02/09/2013  . SOB (shortness of breath) 02/09/2013  . Blount's disease 02/09/2013    Assessment & Plan: Super morbid obesity for roux en Y gastric bypass or sleeve gastrectomy if unable to perform bypass.      Matt B. Hassell Done, MD, [facility] [phone] beeper [phone]  06/21/2015 4:45 PM

## 2015-07-08 NOTE — Interval H&P Note (Signed)
History and Physical Interval Note:  07/08/2015 7:07 AM  Anthony Cox  has presented today for surgery, with the diagnosis of Morbid Obesity  The various methods of treatment have been discussed with the patient and family. After consideration of risks, benefits and other options for treatment, the patient has consented to  Procedure(s): LAPAROSCOPIC ROUX-EN-Y GASTRIC BYPASS WITH UPPER ENDOSCOPY (N/A) as a surgical intervention .  The patient's history has been reviewed, patient examined, no change in status, stable for surgery.  I have reviewed the patient's chart and labs.  Questions were answered to the patient's satisfaction.     [name] B

## 2015-07-08 NOTE — Anesthesia Procedure Notes (Signed)
Procedure Name: Intubation Date/Time: 07/08/2015 7:37 AM Performed by: Noralyn Pick D Pre-anesthesia Checklist: Patient identified, Emergency Drugs available, Suction available and Patient being monitored Patient Re-evaluated:Patient Re-evaluated prior to inductionOxygen Delivery Method: Circle System Utilized Preoxygenation: Pre-oxygenation with 100% oxygen Intubation Type: IV induction Ventilation: Mask ventilation without difficulty Laryngoscope Size: Mac and 4 Grade View: Grade II Tube type: Oral Tube size: 7.5 mm Number of attempts: 1 Airway Equipment and Method: Stylet and Oral airway Placement Confirmation: ETT inserted through vocal cords under direct vision,  positive ETCO2 and breath sounds checked- equal and bilateral Secured at: 23 cm Tube secured with: Tape Dental Injury: Teeth and Oropharynx as per pre-operative assessment

## 2015-07-08 NOTE — Transfer of Care (Signed)
Immediate Anesthesia Transfer of Care Note  Patient: Anthony Cox  Procedure(s) Performed: Procedure(s): LAPAROSCOPIC GASTRIC SLEEVE RESECTION (N/A) UPPER GI ENDOSCOPY (N/A)  Patient Location: PACU  Anesthesia Type:General  Level of Consciousness: awake, alert  and oriented  Airway & Oxygen Therapy: Patient Spontanous Breathing and Patient connected to face mask oxygen  Post-op Assessment: Report given to RN and Post -op Vital signs reviewed and stable  Post vital signs: Reviewed and stable  Last Vitals:  Filed Vitals:   07/08/15 0554  BP: 155/86  Pulse: 88  Temp: 36.6 C  Resp: 16    Complications: No apparent anesthesia complications

## 2015-07-08 NOTE — Op Note (Signed)
Surgeon: Kaylyn Lim, MD, FACS  Asst:  Gurney Maxin, MD  Anes:  General endotracheal  Procedure: Laparoscopic sleeve gastrectomy and upper endoscopy  Diagnosis: Morbid obesity BMI 81  Complications: none  EBL:   minimal cc  Description of Procedure:  The patient was take to OR 1 and given general anesthesia.  The abdomen was prepped with PCMX and draped sterilely.  A timeout was performed.  Access to the abdomen was achieved with a 150 mm 12 mm Optiview through the left upper quadrant.  Following insufflation the abdomen and liver and the fixation of the trocars was felt to limit roux Y gastric bypass and made sleeve gastrectomy possible.  The abdominal pannus maxed out the 15 mm trocars.  Following insufflation, the state of the abdomen was found to be free of adhesions.  The ViSiGi 36Fr tube was inserted to deflate the stomach and was pulled back into the esophagus.    The pylorus was identified and we measured 6 cm back and marked the antrum.  At that point we began dissection to take down the greater curvature of the stomach using the Harmonic scalpel.  This dissection was taken all the way up to the left crus.  Posterior attachments of the stomach were also taken down.    The ViSiGi tube was then passed into the antrum and suction applied so that it was snug along the lessor curvature.  The "crow's foot" or incisura was identified.  The sleeve gastrectomy was begun using the [employer] stapler beginning with a 4.5 cm black load with reinforcement.  This was followed by multiple black firings with reinforcement.    When the sleeve was complete the tube was taken off suction and insufflated briefly.  The tube was withdrawn.  Upper endoscopy was then performed by Dr. Kieth Brightly and no leaks or bleeds were noted.     The specimen was extracted through the 15 trocar site which required opening wider and opening the fascia.  It was closed with 2 Endoclose and the 0 vicryl.  Wounds  were infiltrated with Exparel and closed with 4-0 Vicryl and Dermabond.  The wound suction device (Prevena) was applied to the extraction site.    Matt B. Hassell Done, [place], [facility], [place]

## 2015-07-08 NOTE — Op Note (Signed)
Preoperative diagnosis: laparoscopic sleeve gastrectomy  Postoperative diagnosis: Same   Procedure: Upper endoscopy   Surgeon: Luke Kinsinger, M.D.  Anesthesia: Gen.   Indications for procedure: This patient was undergoing a laparoscopic sleeve gastrectomy.   Description of procedure: The endoscopy was placed in the mouth and into the oropharynx and under endoscopic vision it was advanced to the esophagogastric junction. The pouch was insufflated and no bleeding or bubbles were seen. The GEJ was identified at 45cm from the teeth. No bleeding or leaks were detected. The scope was withdrawn without difficulty.   Luke Kinsinger, M.D. General, Bariatric, & Minimally Invasive Surgery [facility]    

## 2015-07-09 ENCOUNTER — Encounter (HOSPITAL_COMMUNITY): Payer: Self-pay | Admitting: Surgery

## 2015-07-09 ENCOUNTER — Inpatient Hospital Stay (HOSPITAL_COMMUNITY): Payer: Medicare Other

## 2015-07-09 LAB — CBC WITH DIFFERENTIAL/PLATELET
Basophils Absolute: 0 10*3/uL (ref 0.0–0.1)
Basophils Relative: 0 %
Eosinophils Absolute: 0 10*3/uL (ref 0.0–0.7)
Eosinophils Relative: 0 %
HCT: 44.6 % (ref 39.0–52.0)
Hemoglobin: 14.6 g/dL (ref 13.0–17.0)
Lymphocytes Relative: 10 %
Lymphs Abs: 0.7 10*3/uL (ref 0.7–4.0)
MCH: 26.1 pg (ref 26.0–34.0)
MCHC: 32.7 g/dL (ref 30.0–36.0)
MCV: 79.6 fL (ref 78.0–100.0)
Monocytes Absolute: 0.6 10*3/uL (ref 0.1–1.0)
Monocytes Relative: 8 %
Neutro Abs: 6.2 10*3/uL (ref 1.7–7.7)
Neutrophils Relative %: 82 %
Platelets: 280 10*3/uL (ref 150–400)
RBC: 5.6 MIL/uL (ref 4.22–5.81)
RDW: 15 % (ref 11.5–15.5)
WBC: 7.5 10*3/uL (ref 4.0–10.5)

## 2015-07-09 LAB — HEMOGLOBIN AND HEMATOCRIT, BLOOD
HCT: 45.3 % (ref 39.0–52.0)
Hemoglobin: 14.9 g/dL (ref 13.0–17.0)

## 2015-07-09 MED ORDER — IOHEXOL 300 MG/ML  SOLN
50.0000 mL | Freq: Once | INTRAMUSCULAR | Status: AC | PRN
  Administered 2015-07-09: 50 mL via ORAL

## 2015-07-09 NOTE — Plan of Care (Signed)
Problem: Food- and Nutrition-Related Knowledge Deficit (NB-1.1) Goal: Nutrition education Formal process to instruct or train a patient/client in a skill or to impart knowledge to help patients/clients voluntarily manage or modify food choices and eating behavior to maintain or improve health. Outcome: Completed/Met Date Met:  07/09/15 Nutrition Education Note  Received consult for diet education per DROP protocol.   Discussed 2 week post op diet with pt. Emphasized that liquids must be non carbonated, non caffeinated, and sugar free. Fluid goals discussed. Pt to follow up with outpatient bariatric RD for further diet progression after 2 weeks. Multivitamins and minerals also reviewed. Teach back method used, pt expressed understanding, expect good compliance.   Diet: First 2 Weeks  You will see the nutritionist about two (2) weeks after your surgery. The nutritionist will increase the types of foods you can eat if you are handling liquids well:  If you have severe vomiting or nausea and cannot handle clear liquids lasting longer than 1 day, call your surgeon  Protein Shake  Drink at least 2 ounces of shake 5-6 times per day  Each serving of protein shakes (usually 8 - 12 ounces) should have a minimum of:  15 grams of protein  And no more than 5 grams of carbohydrate  Goal for protein each day:  Men = 80 grams per day  Women = 60 grams per day  Protein powder may be added to fluids such as non-fat milk or Lactaid milk or Soy milk (limit to 35 grams added protein powder per serving)   Hydration  Slowly increase the amount of water and other clear liquids as tolerated (See Acceptable Fluids)  Slowly increase the amount of protein shake as tolerated  Sip fluids slowly and throughout the day  May use sugar substitutes in small amounts (no more than 6 - 8 packets per day; i.e. Splenda)   Fluid Goal  The first goal is to drink at least 8 ounces of protein shake/drink per day (or as directed  by the nutritionist); some examples of protein shakes are Syntrax Nectar, Adkins Advantage, EAS Edge HP, and Unjury. See handout from pre-op Bariatric Education Class:  Slowly increase the amount of protein shake you drink as tolerated  You may find it easier to slowly sip shakes throughout the day  It is important to get your proteins in first  Your fluid goal is to drink 64 - 100 ounces of fluid daily  It may take a few weeks to build up to this  32 oz (or more) should be clear liquids  And  32 oz (or more) should be full liquids (see below for examples)  Liquids should not contain sugar, caffeine, or carbonation   Clear Liquids:  Water or Sugar-free flavored water (i.e. Fruit H2O, Propel)  Decaffeinated coffee or tea (sugar-free)  Crystal Lite, Wyler's Lite, Minute Maid Lite  Sugar-free Jell-O  Bouillon or broth  Sugar-free Popsicle: *Less than 20 calories each; Limit 1 per day   Full Liquids:  Protein Shakes/Drinks + 2 choices per day of other full liquids  Full liquids must be:  No More Than 12 grams of Carbs per serving  No More Than 3 grams of Fat per serving  Strained low-fat cream soup  Non-Fat milk  Fat-free Lactaid Milk  Sugar-free yogurt (Dannon Lite & Fit, Greek yogurt)     Saraih Lorton, MS, RD, LDN Pager: [phone] After Hours Pager: [phone]        

## 2015-07-09 NOTE — Progress Notes (Signed)
Pt placed on CPAP qhs.  Machine plugged into red outlet, humidifier filled with sterile water, and machine set to automode 14-20 cm H2O.  Pt resting stable and comfortable.  RT will continue to monitor as needed.

## 2015-07-09 NOTE — Discharge Instructions (Signed)

## 2015-07-09 NOTE — Care Management Note (Signed)
Case Management Note  Patient Details  Name: Thorton Csizmadia MRN: [id number] Date of Birth: 06/03/1991  Subjective/Objective:                 Gastric sleeve    Action/Plan:Date: July 09, 2015 Chart reviewed for concurrent status and case management needs. Will continue to follow patient for changes and needs: Velva Harman, RN, BSN, [place]   [phone]   Expected Discharge Date:                  Expected Discharge Plan:  Home/Self Care  In-House Referral:  NA  Discharge planning Services  CM Consult  Post Acute Care Choice:  NA Choice offered to:  NA  DME Arranged:  N/A DME Agency:  NA  HH Arranged:  NA HH Agency:  NA  Status of Service:  In process, will continue to follow  Medicare Important Message Given:    Date Medicare IM Given:    Medicare IM give by:    Date Additional Medicare IM Given:    Additional Medicare Important Message give by:     If discussed at [place] of Stay Meetings, dates discussed:    Additional Comments:  Leeroy Cha, RN 07/09/2015, 12:13 PM

## 2015-07-09 NOTE — Progress Notes (Signed)
Patient ID: Anthony Cox, male   DOB: 1991-04-18, 24 y.o.   MRN: [id number] [facility] Progress Note:   1 Day Post-Op  Subjective: Mental status is clear.  Was in stepdown this morning when I saw him Objective: Vital signs in last 24 hours: Temp:  [97.7 F (36.5 C)-100 F (37.8 C)] 98.9 F (37.2 C) (11/29 2134) Pulse Rate:  [80-111] 85 (11/29 2134) Resp:  [18-31] 18 (11/29 2134) BP: (151-177)/(92-107) 157/93 mmHg (11/29 2134) SpO2:  [92 %-100 %] 96 % (11/29 2134)  Intake/Output from previous day: 11/28 0701 - 11/29 0700 In: 4008.3 [I.V.:4008.3] Out: 4275 [Urine:4275] Intake/Output this shift:    Physical Exam: Work of breathing is not labored  Lab Results:  Results for orders placed or performed during the hospital encounter of 07/08/15 (from the past 48 hour(s))  Glucose, capillary     Status: Abnormal   Collection Time: 07/08/15  5:58 AM  Result Value Ref Range   Glucose-Capillary 117 (H) 65 - 99 mg/dL   Comment 1 Notify RN   Glucose, capillary     Status: Abnormal   Collection Time: 07/08/15 10:43 AM  Result Value Ref Range   Glucose-Capillary 227 (H) 65 - 99 mg/dL  CBC     Status: Abnormal   Collection Time: 07/08/15 11:48 AM  Result Value Ref Range   WBC 11.6 (H) 4.0 - 10.5 K/uL   RBC 5.53 4.22 - 5.81 MIL/uL   Hemoglobin 14.4 13.0 - 17.0 g/dL   HCT 44.3 39.0 - 52.0 %   MCV 80.1 78.0 - 100.0 fL   MCH 26.0 26.0 - 34.0 pg   MCHC 32.5 30.0 - 36.0 g/dL   RDW 15.2 11.5 - 15.5 %   Platelets 260 150 - 400 K/uL  Creatinine, serum     Status: None   Collection Time: 07/08/15 11:48 AM  Result Value Ref Range   Creatinine, Ser 0.92 0.61 - 1.24 mg/dL   GFR calc non Af Amer >60 >60 mL/min   GFR calc Af Amer >60 >60 mL/min    Comment: (NOTE) The eGFR has been calculated using the CKD EPI equation. This calculation has not been validated in all clinical situations. eGFR's persistently <60 mL/min signify possible Chronic Kidney Disease.   MRSA PCR  Screening     Status: None   Collection Time: 07/08/15  1:41 PM  Result Value Ref Range   MRSA by PCR NEGATIVE NEGATIVE    Comment:        The GeneXpert MRSA Assay (FDA approved for NASAL specimens only), is one component of a comprehensive MRSA colonization surveillance program. It is not intended to diagnose MRSA infection nor to guide or monitor treatment for MRSA infections.   CBC WITH DIFFERENTIAL     Status: None   Collection Time: 07/09/15  3:46 AM  Result Value Ref Range   WBC 7.5 4.0 - 10.5 K/uL   RBC 5.60 4.22 - 5.81 MIL/uL   Hemoglobin 14.6 13.0 - 17.0 g/dL   HCT 44.6 39.0 - 52.0 %   MCV 79.6 78.0 - 100.0 fL   MCH 26.1 26.0 - 34.0 pg   MCHC 32.7 30.0 - 36.0 g/dL   RDW 15.0 11.5 - 15.5 %   Platelets 280 150 - 400 K/uL   Neutrophils Relative % 82 %   Neutro Abs 6.2 1.7 - 7.7 K/uL   Lymphocytes Relative 10 %   Lymphs Abs 0.7 0.7 - 4.0 K/uL   Monocytes Relative 8 %  Monocytes Absolute 0.6 0.1 - 1.0 K/uL   Eosinophils Relative 0 %   Eosinophils Absolute 0.0 0.0 - 0.7 K/uL   Basophils Relative 0 %   Basophils Absolute 0.0 0.0 - 0.1 K/uL  Hemoglobin and hematocrit, blood     Status: None   Collection Time: 07/09/15  8:45 PM  Result Value Ref Range   Hemoglobin 14.9 13.0 - 17.0 g/dL   HCT 45.3 39.0 - 52.0 %    Radiology/Results: Dg Ugi W/water Sol Cm  07/09/2015  CLINICAL DATA:  24 year old male post gastric sleeve procedure. Subsequent encounter. EXAM: WATER SOLUBLE UPPER GI SERIES TECHNIQUE: Single-column upper GI series was performed using water soluble contrast. CONTRAST:  64mL OMNIPAQUE IOHEXOL 300 MG/ML  SOLN COMPARISON:  Preoperative upper GI series 714 2016. FLUOROSCOPY TIME:  Radiation Exposure Index (as provided by the fluoroscopic device): 25,395 micro Gy/meter. 24 seconds of fluoroscopic time. FINDINGS: Post gastric sleeve procedure. Minimal holdup of forward flow of ingested contrast initially. This did not persist. No leak identified. IMPRESSION: Post  gastric sleeve procedure. No significant holdup of ingested contrast. No leak identified Electronically Signed   By: Genia Del M.D.   On: 07/09/2015 09:59    Anti-infectives: Anti-infectives    Start     Dose/Rate Route Frequency Ordered Stop   07/08/15 0617  cefOXitin (MEFOXIN) 2 g in dextrose 5 % 50 mL IVPB     2 g 100 mL/hr over 30 Minutes Intravenous On call to O.R. 07/08/15 0617 07/08/15 0934      Assessment/Plan: Problem List: Patient Active Problem List   Diagnosis Date Noted  . Morbid obesity with body mass index of 70 and over in adult [facility]) 07/08/2015  . Leukocytosis, unspecified 02/10/2013  . Morbid obesity ([place]) 02/10/2013  . Fever, unspecified 02/10/2013  . Headache(784.0) 02/10/2013  . Cellulitis of leg, left 02/10/2013  . Sepsis ([place]) 02/09/2013  . Acute respiratory failure ([place]) 02/09/2013  . SOB (shortness of breath) 02/09/2013  . Blount's disease 02/09/2013    Transferred to floor  1 Day Post-Op    LOS: 1 day   Matt B. Hassell Done, MD, [facility] [phone] beeper [phone]  07/09/2015 10:59 PM

## 2015-07-09 NOTE — Progress Notes (Signed)
Patient alert and oriented, Post op day 1.  Provided support and encouragement.  Encouraged pulmonary toilet, ambulation and small sips of liquids.  All questions answered.  Will continue to monitor. 

## 2015-07-10 DIAGNOSIS — Z9884 Bariatric surgery status: Secondary | ICD-10-CM

## 2015-07-10 LAB — CBC WITH DIFFERENTIAL/PLATELET
Basophils Absolute: 0 10*3/uL (ref 0.0–0.1)
Basophils Relative: 0 %
Eosinophils Absolute: 0 10*3/uL (ref 0.0–0.7)
Eosinophils Relative: 0 %
HCT: 43.6 % (ref 39.0–52.0)
Hemoglobin: 14.3 g/dL (ref 13.0–17.0)
Lymphocytes Relative: 28 %
Lymphs Abs: 2.7 10*3/uL (ref 0.7–4.0)
MCH: 26 pg (ref 26.0–34.0)
MCHC: 32.8 g/dL (ref 30.0–36.0)
MCV: 79.3 fL (ref 78.0–100.0)
Monocytes Absolute: 0.9 10*3/uL (ref 0.1–1.0)
Monocytes Relative: 9 %
Neutro Abs: 6.1 10*3/uL (ref 1.7–7.7)
Neutrophils Relative %: 63 %
Platelets: 258 10*3/uL (ref 150–400)
RBC: 5.5 MIL/uL (ref 4.22–5.81)
RDW: 15.1 % (ref 11.5–15.5)
WBC: 9.6 10*3/uL (ref 4.0–10.5)

## 2015-07-10 MED ORDER — ENOXAPARIN (LOVENOX) PATIENT EDUCATION KIT
PACK | Status: AC
  Administered 2015-07-10: 15:00:00
  Filled 2015-07-10: qty 1

## 2015-07-10 MED ORDER — ENOXAPARIN SODIUM 30 MG/0.3ML ~~LOC~~ SOLN: 30.0000 mg | Freq: Two times a day (BID) | SUBCUTANEOUS | Status: DC

## 2015-07-10 MED ORDER — ONDANSETRON 4 MG PO TBDP
4.0000 mg | ORAL_TABLET | Freq: Once | ORAL | Status: DC
  Filled 2015-07-10 (×2): qty 1

## 2015-07-10 MED ORDER — ENOXAPARIN SODIUM 30 MG/0.3ML ~~LOC~~ SOLN
30.0000 mg | SUBCUTANEOUS | Status: AC
  Administered 2015-07-10: 30 mg via SUBCUTANEOUS
  Filled 2015-07-10: qty 0.3

## 2015-07-10 MED ORDER — ENOXAPARIN SODIUM 30 MG/0.3ML ~~LOC~~ SOLN
30.0000 mg | Freq: Two times a day (BID) | SUBCUTANEOUS | Status: DC
  Filled 2015-07-10: qty 0.3

## 2015-07-10 NOTE — Discharge Summary (Signed)
Physician Discharge Summary  Patient ID: Anthony Cox MRN: [id number] DOB/AGE: Dec 18, 1990 24 y.o.  Admit date: 07/08/2015 Discharge date: 07/10/2015  Admission Diagnoses:  Super morbid obesity with BMI > 80  Discharge Diagnoses:  same  Principal Problem:   S/P laparoscopic sleeve gastrectomy Nov 2016 Active Problems:   Morbid obesity with body mass index of 70 and over in adult [facility])   Surgery:  Laparoscopic sleeve gastrectomy  Discharged Condition: improved  Hospital Course:   Had surgery.  Unable to do bypass.  Performed sleeve gastrectomy.  Observed in stepdown overnight and then transferred to the floor  Consults: none  Significant Diagnostic Studies: none    Discharge Exam: Blood pressure 160/90, pulse 90, temperature 98.3 F (36.8 C), temperature source Oral, resp. rate 18, height 5\' 10"  (1.778 m), weight 241.455 kg (532 lb 5 oz), SpO2 91 %. Incisions ok.  Provenal suction on larger extraction site.  Will remove in 5 days.  Will place on Lovenox for 3 weeks postop.    Disposition: 01-Home or Self Care  Discharge Instructions    Ambulate hourly while awake    Complete by:  As directed      Call MD for:  difficulty breathing, headache or visual disturbances    Complete by:  As directed      Call MD for:  persistant dizziness or light-headedness    Complete by:  As directed      Call MD for:  persistant nausea and vomiting    Complete by:  As directed      Call MD for:  redness, tenderness, or signs of infection (pain, swelling, redness, odor or green/yellow discharge around incision site)    Complete by:  As directed      Call MD for:  severe uncontrolled pain    Complete by:  As directed      Call MD for:  temperature >101 F    Complete by:  As directed      Diet bariatric full liquid    Complete by:  As directed      Incentive spirometry    Complete by:  As directed   Perform hourly while awake            Medication List    TAKE these medications         acetaminophen 325 MG tablet  Commonly known as:  TYLENOL  Take 650 mg by mouth every 6 (six) hours as needed for mild pain.     furosemide 40 MG tablet  Commonly known as:  LASIX  Take 40 mg by mouth daily as needed (for swelling in the legs).  Notes to Patient:  Monitor Blood Pressure Daily and keep a log for primary care physician.  Monitor for symptoms of dehydration.  You may need to make changes to your medications with rapid weight loss.        metFORMIN 500 MG 24 hr tablet  Commonly known as:  GLUCOPHAGE-XR  Take 500 mg by mouth daily.  Notes to Patient:  This medication cannot be crushed or cut in half.  Monitor Blood Sugar Frequently and keep a log for primary care physician, you may need to adjust medication dosage with rapid weight loss.              Follow-up Information    Follow up with Pedro Earls, MD. Go on 07/24/2015.   Specialty:  General Surgery   Why:  For Post-Op Check at 3:45PM   Contact information:  [address] [place] [address] [phone]       Signed: Pedro Earls 07/10/2015, 11:45 AM

## 2015-07-10 NOTE — Progress Notes (Signed)
Pt mother and patient watched video on lovenox administration.  Able to anwser questions able  Where  to inject med. Mother able to give dose for me prior to discharge.

## 2015-07-10 NOTE — Progress Notes (Signed)
ANTICOAGULATION CONSULT NOTE - Initial Consult  Pharmacy Consult for Lovenox Indication: VTE prophylaxis for 3 weeks post-op  No Known Allergies  Patient Measurements: Height: 5\' 10"  (177.8 cm) Weight: (!) 532 lb 5 oz (241.455 kg) IBW/kg (Calculated) : 73  Vital Signs: Temp: 98.3 F (36.8 C) (11/30 1000) Temp Source: Oral (11/30 1000) BP: 160/90 mmHg (11/30 1000) Pulse Rate: 90 (11/30 1000)  Labs:  Recent Labs  07/08/15 1148 07/09/15 0346 07/09/15 2045 07/10/15 0610  HGB 14.4 14.6 14.9 14.3  HCT 44.3 44.6 45.3 43.6  PLT 260 280  --  258  CREATININE 0.92  --   --   --     Estimated Creatinine Clearance: 252.2 mL/min (by C-G formula based on Cr of 0.92).   Medical History: Past Medical History  Diagnosis Date  . Blount's disease   . OSA (obstructive sleep apnea)     noncompliant with cpap  . Diabetes mellitus without complication ([place])   . Morbid obesity ([place])   . Anemia   . GERD (gastroesophageal reflux disease)   . Lower leg edema     left; comes and goes   . Shortness of breath dyspnea     on exertion   . History of strep sore throat   . Headache     comes and goes; pt states relates to OSA  . History of cellulitis     left leg     Assessment: 23 y/oM with morbid obesity who underwent laparoscopic sleeve gastrectomy on 07/08/15. Patient received SQ heparin and placed on SCDs post-op for VTE prophylaxis. Now pharmacy asked to assist with dosing of Lovenox for 3 weeks at discharge    BMI ~ 76  CrCl > 100 ml/min  CBC WNL  No bleeding issues reported   Goal of Therapy:  Absence of VTE   Plan:   Lovenox 30 mg SQ q12h x 3 weeks.  Can consider increasing to 40 mg SQ q12h if MD deems VTE risk high enough. There is a lot of variation in dosing strategies in literature, but primarily indicate standard dosing of Lovenox adequate for VTE prevention and that higher weight-adjusted doses resulted in increased incidence of bleeding.   Monitor closely  for s/s of bleeding.   Lindell Spar, PharmD, BCPS Pager: [phone] 07/10/2015 12:47 PM

## 2015-07-17 ENCOUNTER — Telehealth (HOSPITAL_COMMUNITY): Payer: Self-pay

## 2015-07-17 NOTE — Telephone Encounter (Signed)
Attempted discharge call, no answer, left message to return call  Made discharge phone call to patient per DROP protocol. Asking the following questions.    1. Do you have someone to care for you now that you are home?   2. Are you having pain now that is not relieved by your pain medication?   3. Are you able to drink the recommended daily amount of fluids (48 ounces minimum/day) and protein (60-80 grams/day) as prescribed by the dietitian or nutritional counselor?   4. Are you taking the vitamins and minerals as prescribed?   5. Do you have the "on call" number to contact your surgeon if you have a problem or question?   6. Are your incisions free of redness, swelling or drainage? (If steri strips, address that these can fall off, shower as tolerated)  7. Have your bowels moved since your surgery?  If not, are you passing gas?   8. Are you up and walking 3-4 times per day?

## 2015-07-23 ENCOUNTER — Encounter: Payer: Medicare Other | Attending: Surgery

## 2015-07-23 VITALS — Ht 69.0 in | Wt >= 6400 oz

## 2015-07-23 DIAGNOSIS — Z713 Dietary counseling and surveillance: Secondary | ICD-10-CM | POA: Insufficient documentation

## 2015-07-23 DIAGNOSIS — Z6841 Body Mass Index (BMI) 40.0 and over, adult: Secondary | ICD-10-CM | POA: Diagnosis not present

## 2015-07-24 NOTE — Progress Notes (Signed)
Bariatric Class:  Appt start time: 1530 end time:  1630.  2 Week Post-Operative Nutrition Class  Patient was seen on 07/23/2015 for Post-Operative Nutrition [facility].   Surgery date: 07/08/15 Surgery type: RYGB Start weight at [facility]: 567.5 lbs on 02/14/15 Weight today: 508.4 lbs  Weight change: 44.6 lbs  TANITA  BODY COMP RESULTS  06/17/15 07/23/15   BMI (kg/m^2) N/A N/A   Fat Mass (lbs)     Fat Free Mass (lbs)     Total Body Water (lbs)      The following the learning objectives were met by the patient during this course:  Identifies Phase 3A (Soft, High Proteins) Dietary Goals and will begin from 2 weeks post-operatively to 2 months post-operatively  Identifies appropriate sources of fluids and proteins   States protein recommendations and appropriate sources post-operatively  Identifies the need for appropriate texture modifications, mastication, and bite sizes when consuming solids  Identifies appropriate multivitamin and calcium sources post-operatively  Describes the need for physical activity post-operatively and will follow MD recommendations  States when to call healthcare provider regarding medication questions or post-operative complications  Handouts given during class include:  Phase 3A: Soft, High Protein Diet Handout  Follow-Up Plan: Patient will follow-up at [facility] in 6 weeks for 2 month post-op nutrition visit for diet advancement per MD.

## 2015-09-04 ENCOUNTER — Encounter: Payer: Self-pay | Admitting: Dietician

## 2015-09-04 ENCOUNTER — Encounter: Payer: Medicare Other | Attending: Surgery | Admitting: Dietician

## 2015-09-04 DIAGNOSIS — Z713 Dietary counseling and surveillance: Secondary | ICD-10-CM | POA: Diagnosis not present

## 2015-09-04 DIAGNOSIS — Z6841 Body Mass Index (BMI) 40.0 and over, adult: Secondary | ICD-10-CM | POA: Insufficient documentation

## 2015-09-04 NOTE — Patient Instructions (Addendum)
Goals:  Follow Phase 3B: High Protein + Non-Starchy Vegetables  Eat 3-6 small meals/snacks, every 3-5 hrs  Increase lean protein foods to meet 80g goal  Add some veggies but remember to eat protein first!  Keep avoiding juice and starches (corn, peas, bread, potatoes, pasta, rice)  Increase fluid intake to 64oz +  Avoid drinking 15 minutes before, during and 30 minutes after eating  Aim for >30 min of physical activity daily  See your primary care doctor to discuss medications  Work on eating at least 3x a day   Try eating something small for breakfast (seafood, [place] bacon or sausage, cottage cheese, regular cheese, Fair Life milk)   Surgery date: 07/08/15 Surgery type: RYGB Start weight at [facility]: 567.5 lbs on 02/14/15 Weight today: 457.2 lbs Weight change: 51.2 lbs Total weight lost: 110.3 lbs

## 2015-09-04 NOTE — Progress Notes (Signed)
  Follow-up visit:  8 Weeks Post-Operative RYGB Surgery  Medical Nutrition Therapy:  Appt start time: 0845 end time:  0915.  Primary concerns today: Post-operative Bariatric Surgery Nutrition Management. Taivon returns having lost a total of 110 lbs. He reports that he is not eating pork or beef. Does not have an appetite for food or drink in the mornings. Aversion to protein shakes and many foods, stating many of the foods he liked prior to surgery no longer taste good to him. Eating twice a day. Tried spinach and turnip greens, tolerated. He has not seen his PCP since surgery but thinks he has an appointment today or tomorrow.   Surgery date: 07/08/15 Surgery type: RYGB Start weight at [facility]: 567.5 lbs on 02/14/15 Weight today: 457.2 lbs Weight change: 51.2 lbs Total weight lost: 110.3 lbs  TANITA  BODY COMP RESULTS  06/17/15 07/23/15 09/04/15   BMI (kg/m^2) N/A N/A N/A   Fat Mass (lbs)      Fat Free Mass (lbs)      Total Body Water (lbs)       Preferred Learning Style:   No preference indicated   Learning Readiness:   Ready  24-hr recall: Wakes up around 8:30 B (AM): water Snk (AM):   L (2 PM): egg OR [place] (7g) Snk (PM):   D (PM): 3 oz boiled or baked chicken (21g) Snk (PM):   Fluid intake: water, flavored water (68-85 oz total) Estimated total protein intake: ~30 g/day  Medications: see list; no change Supplementation: inconsistent  CBG monitoring: occasionally Average CBG per patient: 113 mg/dL last time he checked Last patient reported A1c: none since surgery  Using straws: no Drinking while eating: none Hair loss: unknown Carbonated beverages: none N/V/D/C: vomiting 2x with [place] burger and overeating on Christmas, had some nausea; constipation  Dumping syndrome: none  Recent physical activity:  Cardio for 30 min on treadmill every other day; went swimming yesterday  Progress Towards Goal(s):  In progress.  Handouts given during visit include:  Phase  3B lean protein + non starchy vegetables   Nutritional Diagnosis:  [place]-3.3 Overweight/obesity related to past poor dietary habits and physical inactivity as evidenced by patient w/ recent RYGB surgery following dietary guidelines for continued weight loss.     Intervention:  Nutrition counseling provided. Goals:  Follow Phase 3B: High Protein + Non-Starchy Vegetables  Eat 3-6 small meals/snacks, every 3-5 hrs  Increase lean protein foods to meet 80g goal  Add some veggies but remember to eat protein first!  Keep avoiding juice and starches (corn, peas, bread, potatoes, pasta, rice)  Increase fluid intake to 64oz +  Avoid drinking 15 minutes before, during and 30 minutes after eating  Aim for >30 min of physical activity daily  See your primary care doctor to discuss medications  Work on eating at least 3x a day   Try eating something small for breakfast (seafood, [place] bacon or sausage, cottage cheese, regular cheese, Fair Life milk)  Teaching Method Utilized:  Visual Auditory Hands on  Barriers to learning/adherence to lifestyle change: food aversions  Demonstrated degree of understanding via:  Teach Back   Monitoring/Evaluation:  Dietary intake, exercise, and body weight. Follow up in 1 months for 3 month post-op visit.

## 2015-10-04 DIAGNOSIS — Z6841 Body Mass Index (BMI) 40.0 and over, adult: Secondary | ICD-10-CM | POA: Diagnosis not present

## 2015-10-04 DIAGNOSIS — Z7984 Long term (current) use of oral hypoglycemic drugs: Secondary | ICD-10-CM | POA: Diagnosis not present

## 2015-10-04 DIAGNOSIS — Z9884 Bariatric surgery status: Secondary | ICD-10-CM | POA: Diagnosis not present

## 2015-10-04 DIAGNOSIS — E1165 Type 2 diabetes mellitus with hyperglycemia: Secondary | ICD-10-CM | POA: Diagnosis not present

## 2015-10-04 DIAGNOSIS — R600 Localized edema: Secondary | ICD-10-CM | POA: Diagnosis not present

## 2015-10-04 DIAGNOSIS — I10 Essential (primary) hypertension: Secondary | ICD-10-CM | POA: Diagnosis not present

## 2015-10-09 ENCOUNTER — Encounter: Payer: Self-pay | Admitting: Dietician

## 2015-10-09 ENCOUNTER — Encounter: Payer: Medicare Other | Attending: Surgery | Admitting: Dietician

## 2015-10-09 DIAGNOSIS — Z6841 Body Mass Index (BMI) 40.0 and over, adult: Secondary | ICD-10-CM | POA: Insufficient documentation

## 2015-10-09 DIAGNOSIS — Z713 Dietary counseling and surveillance: Secondary | ICD-10-CM | POA: Diagnosis not present

## 2015-10-09 NOTE — Progress Notes (Signed)
  Follow-up visit:  3 months Post-Operative RYGB Surgery  Medical Nutrition Therapy:  Appt start time: 1140 end time:  [id number]  Primary concerns today: Post-operative Bariatric Surgery Nutrition Management. Anthony Cox returns having lost another 17 lbs. He states his nausea is resolving and that he is eating more in the mornings. Feeling better after eating. Can tolerate about 3 oz of meat at a time. Eggs no longer taste good. Still does not eat pork but started eating beef again. Having 1-2 protein shakes per day. Had an appointment with PCP and reduced Metformin. Has not had any dumping syndrome.   Surgery date: 07/08/15 Surgery type: RYGB Start weight at [facility]: 567.5 lbs on 02/14/15 Weight today: 457.2 lbs Weight change: 17 lbs Total weight lost: 127 lbs  TANITA  BODY COMP RESULTS  06/17/15 07/23/15 09/04/15 10/09/15   BMI (kg/m^2) N/A N/A N/A N/A   Fat Mass (lbs)       Fat Free Mass (lbs)       Total Body Water (lbs)        Preferred Learning Style:   No preference indicated   Learning Readiness:   Ready  24-hr recall: Wakes up around 8:30 B (AM): Premier protein shake (30g) Snk (AM):   L (2 PM): 3 oz chicken (21g) Snk (PM): sometimes another protein shake (0-30g) D (PM): 3 oz boiled or baked chicken (21g) Snk (PM):   Stops eating before 8pm  Fluid intake: water, flavored water throughout the day (68-85 oz total) Estimated total protein intake: 72-102 g/day  Medications: see list; reduced Metformin Supplementation: inconsistent  CBG monitoring: none Last patient reported A1c: 5.5% (highest A1c 7.6% before surgery per patient)  Using straws: no Drinking while eating: none Hair loss: unknown Carbonated beverages: none N/V/D/C: sometimes goes 2-3 days without a bowel movement but reports no pain Dumping syndrome: none  Recent physical activity:  Daily cardio for 30 min on treadmill every other day and arm weights; swimming 3-4x a week  Progress Towards Goal(s):  In  progress.  Handouts given during visit include:  none   Nutritional Diagnosis:  [place]-3.3 Overweight/obesity related to past poor dietary habits and physical inactivity as evidenced by patient w/ recent RYGB surgery following dietary guidelines for continued weight loss.     Intervention:  Nutrition counseling provided. Goals:  Follow Phase 3B: High Protein + Non-Starchy Vegetables  Eat 3-6 small meals/snacks, every 3-5 hrs  Increase lean protein foods to meet 80g goal  Add some veggies but remember to eat protein first!  Keep avoiding juice and starches (corn, peas, bread, potatoes, pasta, rice)  Increase fluid intake to 64oz +  Avoid drinking 15 minutes before, during and 30 minutes after eating  Aim for >30 min of physical activity daily  See your primary care doctor to discuss medications  Work on eating at least 3x a day   Try eating something small for breakfast (seafood, [place] bacon or sausage, cottage cheese, regular cheese, Fair Life milk)  Teaching Method Utilized:  Visual Auditory Hands on  Barriers to learning/adherence to lifestyle change: none  Demonstrated degree of understanding via:  Teach Back   Monitoring/Evaluation:  Dietary intake, exercise, and body weight. Follow up in 6 months for 9 month post-op visit.

## 2015-10-09 NOTE — Patient Instructions (Addendum)
Goals:  Follow Phase 3B: High Protein + Non-Starchy Vegetables  Eat 3-6 small meals/snacks, every 3-5 hrs  Increase lean protein foods to meet 80g goal  Add some veggies but remember to eat protein first!  Keep avoiding juice and starches (corn, peas, bread, potatoes, pasta, rice)  Increase fluid intake to 64oz +  Avoid drinking 15 minutes before, during and 30 minutes after eating  Aim for >30 min of physical activity daily  Work on eating at least 3x a day   Try eating something small for breakfast (seafood, [place] bacon or sausage, cottage cheese, regular cheese)   Surgery date: 07/08/15 Surgery type: RYGB Start weight at [facility]: 567.5 lbs on 02/14/15 Weight today: 457.2 lbs Weight change: 17 lbs Total weight lost: 127 lbs

## 2016-01-16 DIAGNOSIS — J069 Acute upper respiratory infection, unspecified: Secondary | ICD-10-CM | POA: Diagnosis not present

## 2016-02-20 DIAGNOSIS — Z9884 Bariatric surgery status: Secondary | ICD-10-CM | POA: Diagnosis not present

## 2016-04-03 DIAGNOSIS — Z9884 Bariatric surgery status: Secondary | ICD-10-CM | POA: Diagnosis not present

## 2016-04-03 DIAGNOSIS — E7800 Pure hypercholesterolemia, unspecified: Secondary | ICD-10-CM | POA: Diagnosis not present

## 2016-04-03 DIAGNOSIS — E119 Type 2 diabetes mellitus without complications: Secondary | ICD-10-CM | POA: Diagnosis not present

## 2016-04-03 DIAGNOSIS — R600 Localized edema: Secondary | ICD-10-CM | POA: Diagnosis not present

## 2016-04-06 ENCOUNTER — Ambulatory Visit: Payer: Self-pay | Admitting: Dietician

## 2016-04-23 ENCOUNTER — Encounter: Payer: Medicare Other | Attending: Surgery | Admitting: Dietician

## 2016-04-23 ENCOUNTER — Encounter: Payer: Self-pay | Admitting: Dietician

## 2016-04-23 DIAGNOSIS — M549 Dorsalgia, unspecified: Secondary | ICD-10-CM | POA: Diagnosis not present

## 2016-04-23 DIAGNOSIS — L03119 Cellulitis of unspecified part of limb: Secondary | ICD-10-CM | POA: Diagnosis not present

## 2016-04-23 DIAGNOSIS — Z7984 Long term (current) use of oral hypoglycemic drugs: Secondary | ICD-10-CM | POA: Diagnosis not present

## 2016-04-23 DIAGNOSIS — G4733 Obstructive sleep apnea (adult) (pediatric): Secondary | ICD-10-CM | POA: Diagnosis not present

## 2016-04-23 DIAGNOSIS — Z6841 Body Mass Index (BMI) 40.0 and over, adult: Secondary | ICD-10-CM | POA: Diagnosis not present

## 2016-04-23 DIAGNOSIS — M7989 Other specified soft tissue disorders: Secondary | ICD-10-CM | POA: Insufficient documentation

## 2016-04-23 DIAGNOSIS — E119 Type 2 diabetes mellitus without complications: Secondary | ICD-10-CM | POA: Insufficient documentation

## 2016-04-23 DIAGNOSIS — E669 Obesity, unspecified: Secondary | ICD-10-CM | POA: Diagnosis not present

## 2016-04-23 NOTE — Patient Instructions (Addendum)
Goals:  Work on taking multivitamins every day  Bariatric Advantage chews  Work on eating at least 3x a day   Keep your good habits!!  Surgery date: 07/08/15 Surgery type: RYGB Start weight at [facility]: 567.5 lbs on 02/14/15 Weight today: 401.5 lbs Weight change: 56 lbs Total weight lost: 166 lbs

## 2016-04-23 NOTE — Progress Notes (Signed)
  Follow-up visit:  10 months Post-Operative RYGB Surgery  Medical Nutrition Therapy:  Appt start time: 245 end time:  315  Primary concerns today: Post-operative Bariatric Surgery Nutrition Management.   Tyde returns having lost a total of 166 lbs. Would like to lose another 100 lbs. He notices that he is more mobile and has more stamina. Feels like he can tolerate a much larger volume but he still chooses smaller portions. Does not tolerate fried foods or carbonated drinks. Sensitive to salt. Usually eats 3x a day but sometimes only 2x. Cholesterol is still elevated "but everything else was good." Does not eat pork at all.    Samples provided and patient instructed on proper use: Bariatric Advantage multivitamin chew (Dark cherry - qty 2) Lot#: [id number] Exp:  11/2016  Bariatric Advantage multivitamin chew (strawberry watermelon - qty 2) Lot#: [id number] Exp: 11/2016  Surgery date: 07/08/15 Surgery type: RYGB Start weight at [facility]: 567.5 lbs on 02/14/15 Weight today: 401.5 lbs Weight change: 56 lbs Total weight lost: 166 lbs   Preferred Learning Style:   No preference indicated   Learning Readiness:   Ready  24-hr recall: Wakes up around 8:30 B (AM): eggs and grits Snk (AM):   L (2 PM): 9 oz chicken with vegetables Snk (PM): sometimes another protein shake (0-30g) D (PM): 3 oz boiled or baked chicken (21g) Snk (PM):   Stops eating before 8pm  Fluid intake: mostly water, V8 Splash, Gatorade, sometimes alcohol Estimated total protein intake: 72-102 g/day  Medications: see list; reduced Metformin Supplementation: inconsistent  CBG monitoring: none Last patient reported A1c: 5.5% (highest A1c 7.6% before surgery per patient)  Using straws: no Drinking while eating: none Hair loss: has noticed some thinning in one spot Carbonated beverages: none N/V/D/C: none Dumping syndrome: none  Recent physical activity:  Daily cardio for 30 min on treadmill every other day +  some weights; swimming sometimes  Progress Towards Goal(s):  In progress.  Handouts given during visit include:  none   Nutritional Diagnosis:  [place]-3.3 Overweight/obesity related to past poor dietary habits and physical inactivity as evidenced by patient w/ recent RYGB surgery following dietary guidelines for continued weight loss.     Intervention:  Nutrition counseling provided.  Teaching Method Utilized:  Visual Auditory Hands on  Barriers to learning/adherence to lifestyle change: none  Demonstrated degree of understanding via:  Teach Back   Monitoring/Evaluation:  Dietary intake, exercise, and body weight. Follow up prn.

## 2016-05-09 DIAGNOSIS — E119 Type 2 diabetes mellitus without complications: Secondary | ICD-10-CM | POA: Diagnosis not present

## 2016-05-09 DIAGNOSIS — Z7984 Long term (current) use of oral hypoglycemic drugs: Secondary | ICD-10-CM | POA: Diagnosis not present

## 2016-05-26 ENCOUNTER — Other Ambulatory Visit: Payer: Self-pay | Admitting: Family Medicine

## 2016-05-26 ENCOUNTER — Ambulatory Visit
Admission: RE | Admit: 2016-05-26 | Discharge: 2016-05-26 | Disposition: A | Discharge status: Home / Self Care | Payer: Medicare Other | Source: Ambulatory Visit | Attending: Family Medicine | Admitting: Family Medicine

## 2016-05-26 DIAGNOSIS — S61442A Puncture wound with foreign body of left hand, initial encounter: Secondary | ICD-10-CM | POA: Diagnosis not present

## 2016-05-26 DIAGNOSIS — G8929 Other chronic pain: Secondary | ICD-10-CM

## 2016-05-26 DIAGNOSIS — M25562 Pain in left knee: Principal | ICD-10-CM

## 2016-05-26 DIAGNOSIS — Z23 Encounter for immunization: Secondary | ICD-10-CM | POA: Diagnosis not present

## 2016-06-25 IMAGING — RF DG UGI W/ GASTROGRAFIN
7 series · 15 of 19 positions shown · IV contrast (omnipaque)
Comparison: Preoperative upper GI series [DATE].

CLINICAL DATA: 23-year-old male post gastric sleeve procedure.
Subsequent encounter.

EXAM:
WATER SOLUBLE UPPER GI SERIES
TECHNIQUE: Single-column upper GI series was performed using water soluble
contrast.
CONTRAST:  50mL OMNIPAQUE IOHEXOL 300 MG/ML  SOLN

[Series 1: t abdomen supine · 0.15mm/px · 1 of 1 slices shown]
[im 1/1]
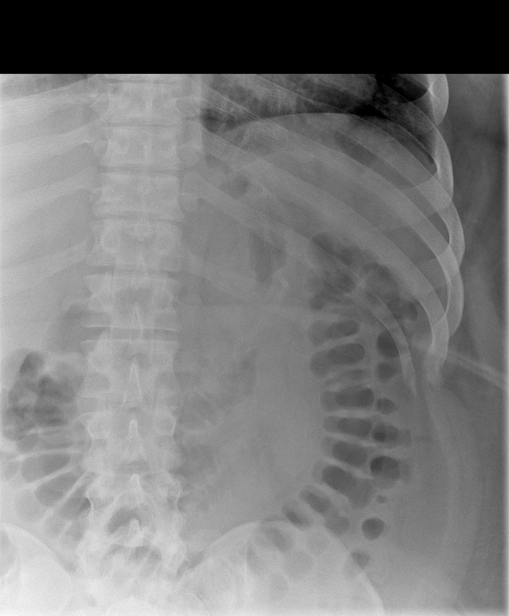

[Series 2: fluoro_iodine 2fps_bw · 0.17mm/px · 1 of 2 frames shown (1 of 6)]
[frame 1/2]
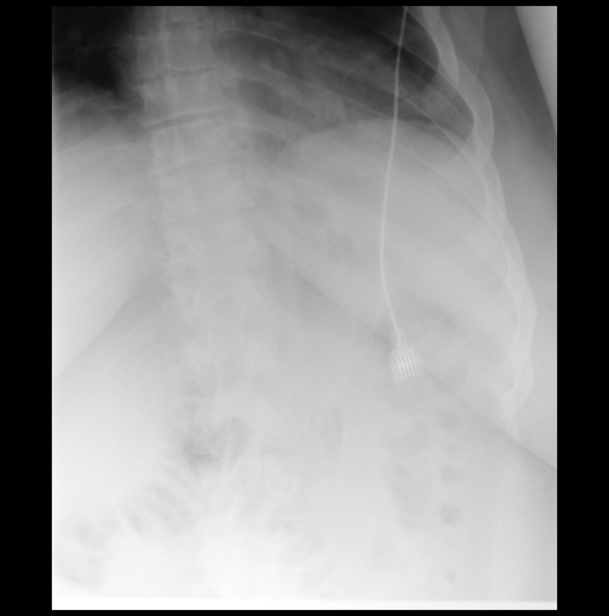

[Series 3: fluoro_iodine 2fps_bw · 0.17mm/px · 3 of 12 frames shown (2 of 6)]
[frame 2/12]
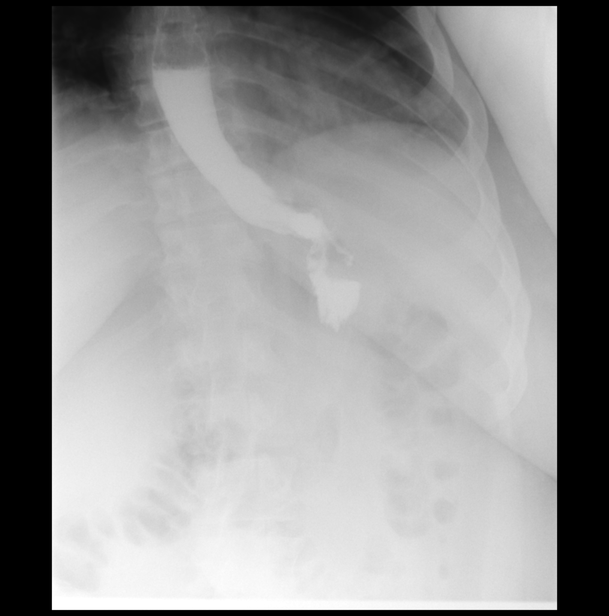
[frame 7/12]
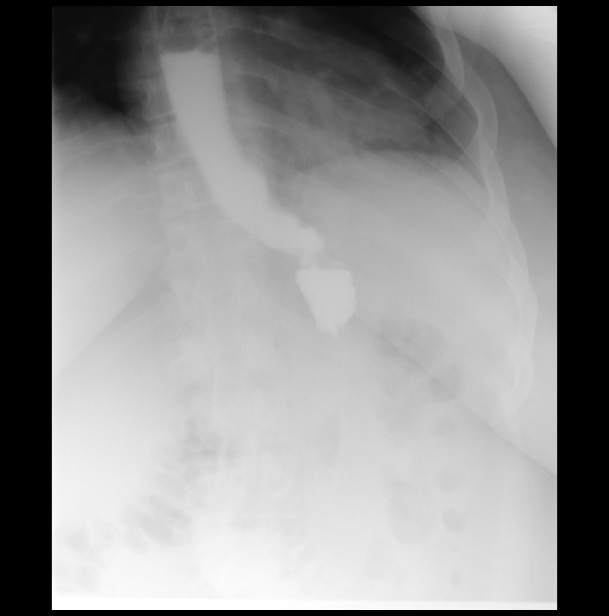
[frame 11/12]
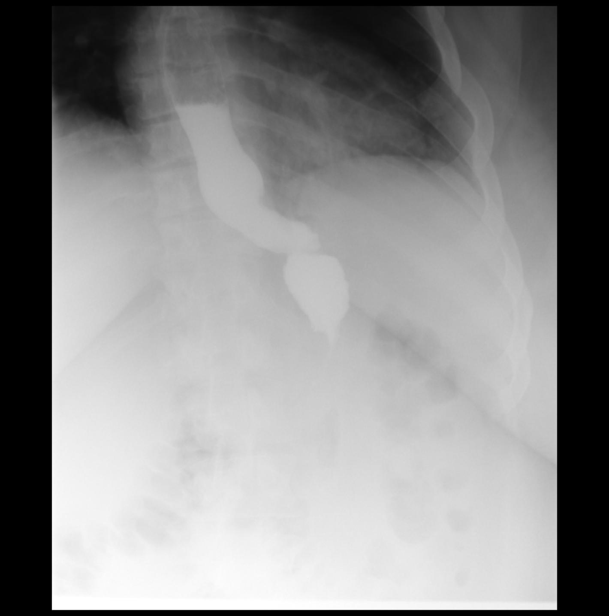

[Series 4: fluoro_iodine 2fps_bw · 0.17mm/px · 2 of 7 frames shown (3 of 6)]
[frame 2/7]
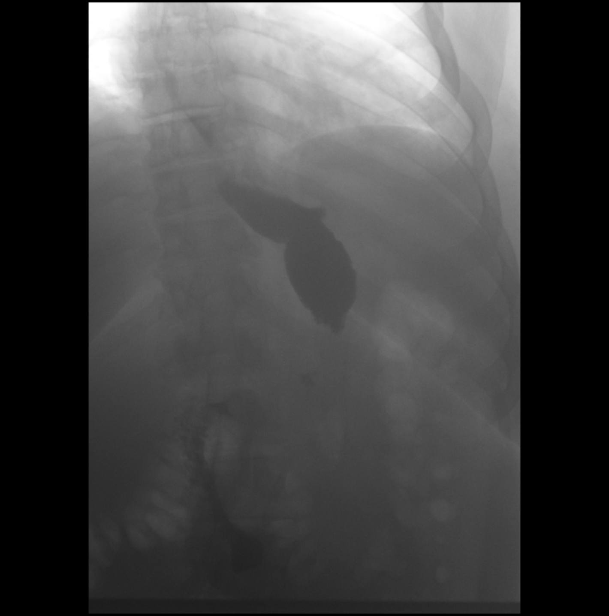
[frame 6/7]
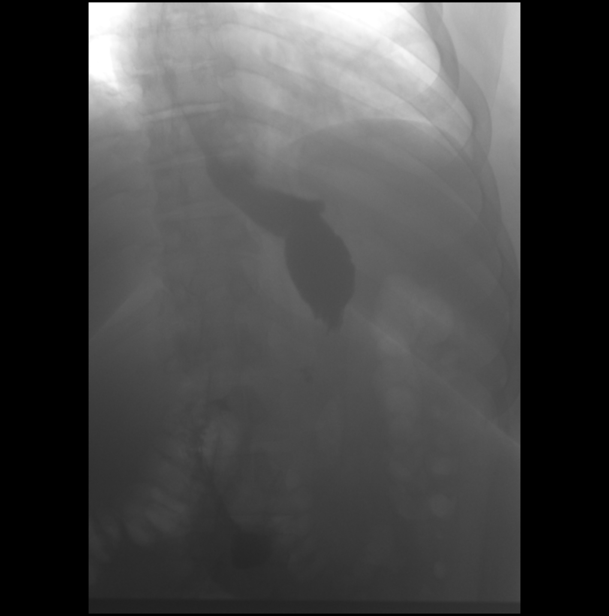

[Series 5: fluoro_iodine 2fps_bw · 0.17mm/px · 3 of 12 frames shown (4 of 6)]
[frame 1/12]
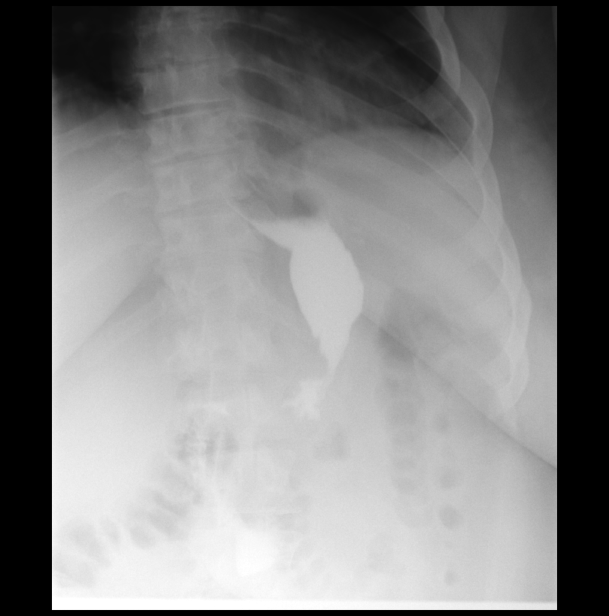
[frame 2/12]
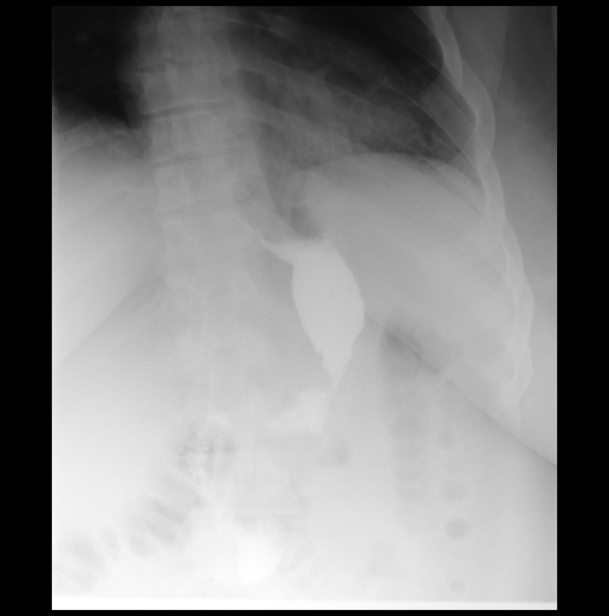
[frame 11/12]
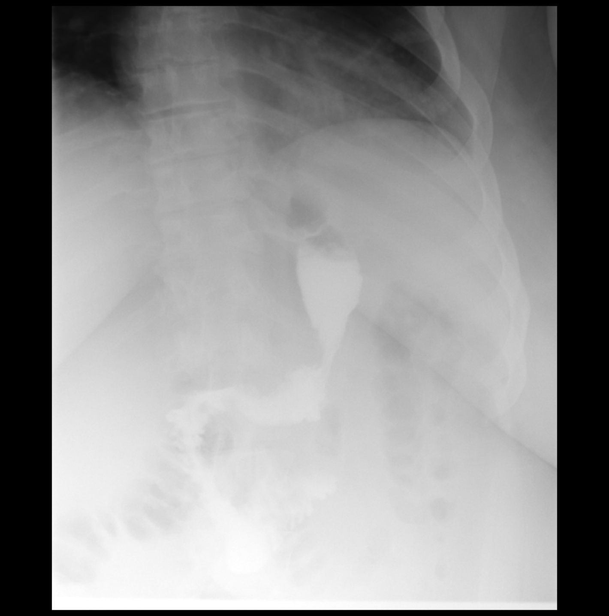

[Series 6: fluoro_iodine 2fps_bw · 0.17mm/px · 3 of 3 frames shown (5 of 6)]
[frame 1/3]
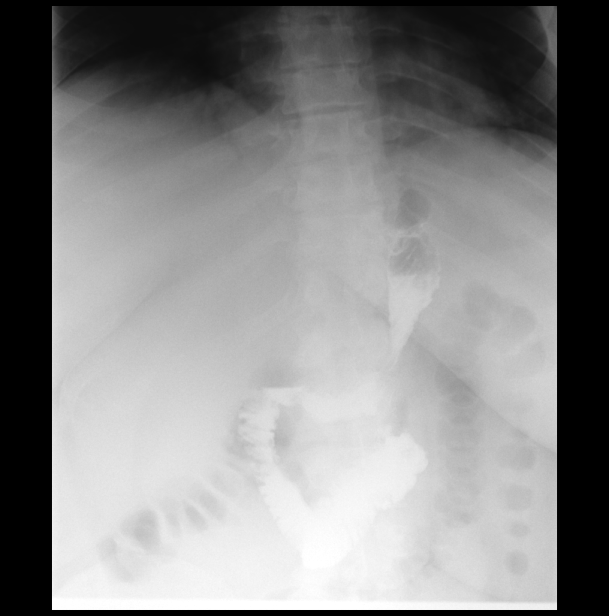
[frame 2/3]
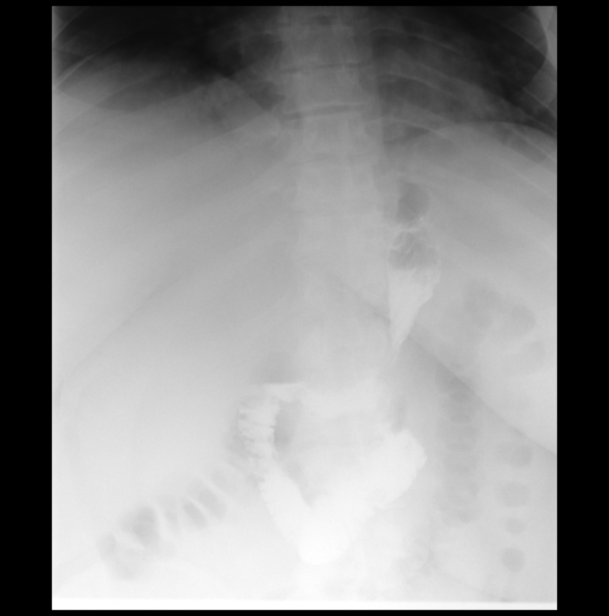
[frame 3/3]
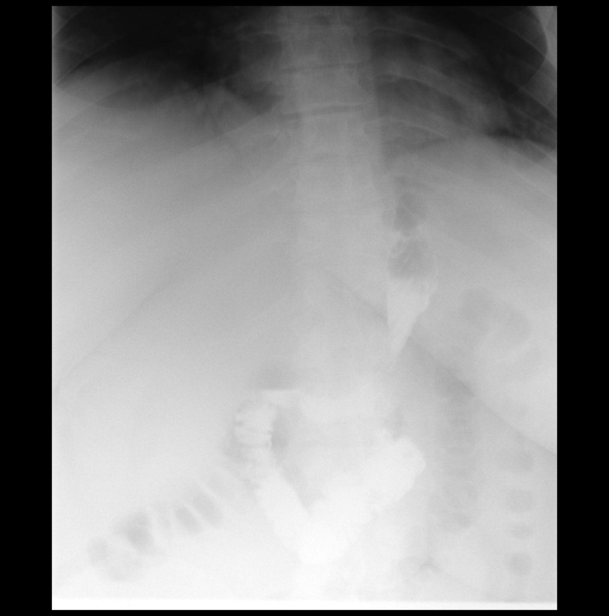

[Series 7: fluoro_iodine 2fps_bw · 0.17mm/px · 2 of 4 frames shown (6 of 6)]
[frame 3/4]
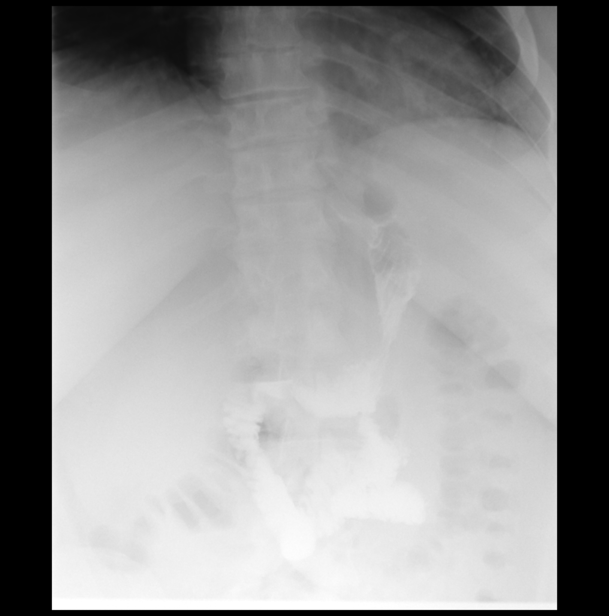
[frame 4/4]
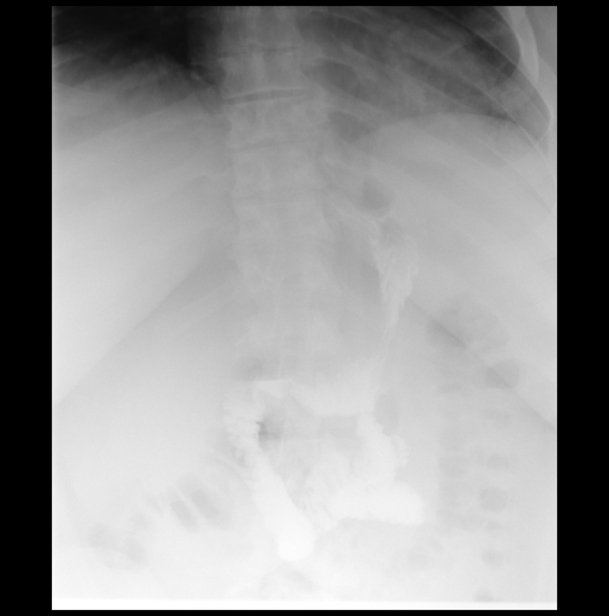

[15 of 19 positions shown; findings below may reference images not displayed]

FLUOROSCOPY TIME:  Radiation Exposure Index (as provided by the
fluoroscopic device): 25,395 micro Gy/meter. 24 seconds of
fluoroscopic time.
FINDINGS: Post gastric sleeve procedure. Minimal holdup of forward flow of
ingested contrast initially. This did not persist. No leak
identified.
IMPRESSION: Post gastric sleeve procedure.

No significant holdup of ingested contrast.

No leak identified

## 2016-10-06 DIAGNOSIS — E7800 Pure hypercholesterolemia, unspecified: Secondary | ICD-10-CM | POA: Diagnosis not present

## 2016-10-06 DIAGNOSIS — G4730 Sleep apnea, unspecified: Secondary | ICD-10-CM | POA: Diagnosis not present

## 2016-10-06 DIAGNOSIS — R030 Elevated blood-pressure reading, without diagnosis of hypertension: Secondary | ICD-10-CM | POA: Diagnosis not present

## 2016-10-06 DIAGNOSIS — E119 Type 2 diabetes mellitus without complications: Secondary | ICD-10-CM | POA: Diagnosis not present

## 2016-10-16 DIAGNOSIS — H6982 Other specified disorders of Eustachian tube, left ear: Secondary | ICD-10-CM | POA: Diagnosis not present

## 2016-10-16 DIAGNOSIS — M79672 Pain in left foot: Secondary | ICD-10-CM | POA: Diagnosis not present

## 2016-10-22 DIAGNOSIS — H6122 Impacted cerumen, left ear: Secondary | ICD-10-CM | POA: Diagnosis not present

## 2016-11-16 DIAGNOSIS — M21061 Valgus deformity, not elsewhere classified, right knee: Secondary | ICD-10-CM | POA: Diagnosis not present

## 2016-11-16 DIAGNOSIS — M9252 Juvenile osteochondrosis of tibia and fibula, left leg: Secondary | ICD-10-CM | POA: Diagnosis not present

## 2016-11-16 DIAGNOSIS — M21062 Valgus deformity, not elsewhere classified, left knee: Secondary | ICD-10-CM | POA: Diagnosis not present

## 2016-11-16 DIAGNOSIS — Z967 Presence of other bone and tendon implants: Secondary | ICD-10-CM | POA: Diagnosis not present

## 2016-11-16 DIAGNOSIS — M21069 Valgus deformity, not elsewhere classified, unspecified knee: Secondary | ICD-10-CM | POA: Diagnosis not present

## 2016-11-16 DIAGNOSIS — M9251 Juvenile osteochondrosis of tibia and fibula, right leg: Secondary | ICD-10-CM | POA: Diagnosis not present

## 2016-11-16 DIAGNOSIS — M79605 Pain in left leg: Secondary | ICD-10-CM | POA: Diagnosis not present

## 2016-12-09 DIAGNOSIS — M7632 Iliotibial band syndrome, left leg: Secondary | ICD-10-CM | POA: Diagnosis not present

## 2016-12-09 DIAGNOSIS — M79605 Pain in left leg: Secondary | ICD-10-CM | POA: Diagnosis not present

## 2016-12-09 DIAGNOSIS — M779 Enthesopathy, unspecified: Secondary | ICD-10-CM | POA: Diagnosis not present

## 2017-02-08 ENCOUNTER — Encounter (HOSPITAL_COMMUNITY): Payer: Self-pay

## 2017-03-18 DIAGNOSIS — M779 Enthesopathy, unspecified: Secondary | ICD-10-CM | POA: Diagnosis not present

## 2017-04-07 DIAGNOSIS — Z903 Acquired absence of stomach [part of]: Secondary | ICD-10-CM | POA: Diagnosis not present

## 2017-04-08 DIAGNOSIS — Z9889 Other specified postprocedural states: Secondary | ICD-10-CM | POA: Diagnosis not present

## 2017-04-08 DIAGNOSIS — M25561 Pain in right knee: Secondary | ICD-10-CM | POA: Diagnosis not present

## 2017-04-08 DIAGNOSIS — M23300 Other meniscus derangements, unspecified lateral meniscus, right knee: Secondary | ICD-10-CM | POA: Diagnosis not present

## 2017-04-14 DIAGNOSIS — G4730 Sleep apnea, unspecified: Secondary | ICD-10-CM | POA: Diagnosis not present

## 2017-04-14 DIAGNOSIS — E7800 Pure hypercholesterolemia, unspecified: Secondary | ICD-10-CM | POA: Diagnosis not present

## 2017-04-14 DIAGNOSIS — R600 Localized edema: Secondary | ICD-10-CM | POA: Diagnosis not present

## 2017-04-14 DIAGNOSIS — R05 Cough: Secondary | ICD-10-CM | POA: Diagnosis not present

## 2017-04-14 DIAGNOSIS — E119 Type 2 diabetes mellitus without complications: Secondary | ICD-10-CM | POA: Diagnosis not present

## 2017-08-18 DIAGNOSIS — M9251 Juvenile osteochondrosis of tibia and fibula, right leg: Secondary | ICD-10-CM | POA: Diagnosis not present

## 2017-08-18 DIAGNOSIS — M7632 Iliotibial band syndrome, left leg: Secondary | ICD-10-CM | POA: Diagnosis not present

## 2017-08-18 DIAGNOSIS — M779 Enthesopathy, unspecified: Secondary | ICD-10-CM | POA: Diagnosis not present

## 2017-08-18 DIAGNOSIS — M79605 Pain in left leg: Secondary | ICD-10-CM | POA: Diagnosis not present

## 2017-08-18 DIAGNOSIS — M9252 Juvenile osteochondrosis of tibia and fibula, left leg: Secondary | ICD-10-CM | POA: Diagnosis not present

## 2017-10-14 DIAGNOSIS — E119 Type 2 diabetes mellitus without complications: Secondary | ICD-10-CM | POA: Diagnosis not present

## 2017-10-14 DIAGNOSIS — R600 Localized edema: Secondary | ICD-10-CM | POA: Diagnosis not present

## 2017-10-14 DIAGNOSIS — E782 Mixed hyperlipidemia: Secondary | ICD-10-CM | POA: Diagnosis not present

## 2017-10-14 DIAGNOSIS — G4730 Sleep apnea, unspecified: Secondary | ICD-10-CM | POA: Diagnosis not present

## 2017-10-22 DIAGNOSIS — H40013 Open angle with borderline findings, low risk, bilateral: Secondary | ICD-10-CM | POA: Diagnosis not present

## 2017-10-22 DIAGNOSIS — H52223 Regular astigmatism, bilateral: Secondary | ICD-10-CM | POA: Diagnosis not present

## 2017-10-22 DIAGNOSIS — H5213 Myopia, bilateral: Secondary | ICD-10-CM | POA: Diagnosis not present

## 2017-11-26 DIAGNOSIS — M25862 Other specified joint disorders, left knee: Secondary | ICD-10-CM | POA: Diagnosis not present

## 2017-11-29 DIAGNOSIS — E119 Type 2 diabetes mellitus without complications: Secondary | ICD-10-CM | POA: Diagnosis not present

## 2017-11-29 DIAGNOSIS — G4733 Obstructive sleep apnea (adult) (pediatric): Secondary | ICD-10-CM | POA: Diagnosis not present

## 2017-11-29 DIAGNOSIS — Z9884 Bariatric surgery status: Secondary | ICD-10-CM | POA: Diagnosis not present

## 2017-12-01 ENCOUNTER — Other Ambulatory Visit (HOSPITAL_BASED_OUTPATIENT_CLINIC_OR_DEPARTMENT_OTHER): Payer: Self-pay

## 2017-12-01 DIAGNOSIS — R0683 Snoring: Secondary | ICD-10-CM

## 2017-12-01 DIAGNOSIS — G4710 Hypersomnia, unspecified: Secondary | ICD-10-CM

## 2017-12-01 DIAGNOSIS — R5383 Other fatigue: Secondary | ICD-10-CM

## 2017-12-01 DIAGNOSIS — G4730 Sleep apnea, unspecified: Secondary | ICD-10-CM

## 2017-12-02 ENCOUNTER — Ambulatory Visit (HOSPITAL_BASED_OUTPATIENT_CLINIC_OR_DEPARTMENT_OTHER): Payer: Medicare Other | Attending: Internal Medicine | Admitting: Internal Medicine

## 2017-12-02 DIAGNOSIS — R0683 Snoring: Secondary | ICD-10-CM

## 2017-12-02 DIAGNOSIS — G4710 Hypersomnia, unspecified: Secondary | ICD-10-CM

## 2017-12-02 DIAGNOSIS — G4730 Sleep apnea, unspecified: Secondary | ICD-10-CM

## 2017-12-02 DIAGNOSIS — G4733 Obstructive sleep apnea (adult) (pediatric): Secondary | ICD-10-CM | POA: Diagnosis not present

## 2017-12-02 DIAGNOSIS — R5383 Other fatigue: Secondary | ICD-10-CM

## 2017-12-12 NOTE — Procedures (Signed)
   NAME: Anthony Cox DATE OF BIRTH:  May 16, 1991 MEDICAL RECORD NUMBER [id number]  [facility]  PHYSICIAN: Marius Ditch  DATE OF STUDY: 12/02/2017  SLEEP STUDY TYPE: Nocturnal Polysomnogram               REFERRING PHYSICIAN: Marius Ditch, MD/Jennifer Percell Miller, PA-C  INDICATION FOR STUDY: h/o OSA, s/p gastric sleep surgery for morbid obesity; patient remains morbidly obese  EPWORTH SLEEPINESS SCORE:  11 HEIGHT: 5\' 10"  (177.8 cm)  WEIGHT: (!) 448 lb (203.2 kg)    Body mass index is 64.28 kg/m.  NECK SIZE: 19.5 in.  MEDICATIONS  Patient self administered medications include: N/A. Medications administered during study include No sleep medicine administered.Marland Kitchen   SLEEP STUDY TECHNIQUE  A multi-channel overnight Polysomnography study was performed. The channels recorded and monitored were central and occipital EEG, electrooculogram (EOG), submentalis EMG (chin), nasal and oral airflow, thoracic and abdominal wall motion, anterior tibialis EMG, snore microphone, electrocardiogram, and a pulse oximetry.   TECHNICAL COMMENTS  Comments added by Technician: patient stated he had difficulty maintaining sleep Comments added by Scorer: N/A   SLEEP ARCHITECTURE  The study was initiated at 11:18:57 PM and terminated at 5:30:34 AM. The total recorded time was 371.6 minutes. EEG confirmed total sleep time was 351.3 minutes yielding a sleep efficiency of 94.5%%. Sleep onset after lights out was 13.3 minutes with a REM latency of 236.0 minutes. The patient spent 9.3%% of the night in stage N1 sleep, 79.4%% in stage N2 sleep, 0.0%% in stage N3 and 11.39% in REM. Wake after sleep onset (WASO) was 7.0 minutes. The Arousal Index was 15.0/hour.   RESPIRATORY PARAMETERS  There were a total of 44 respiratory disturbances out of which 3 were apneas ( 3 obstructive, 0 mixed, 0 central) and 41 hypopneas. The apnea/hypopnea index (AHI) was 7.5 events/hour. The central sleep  apnea index was 0.0 events/hour. The REM AHI was 15.0 events/hour and NREM AHI was 6.6 events/hour. The supine AHI was 0.0 events/hour and the non supine AHI was 9.35/hr. RDI was 12hr and REM RDI was 15/hr. Respiratory disturbances were associated with oxygen desaturation down to a nadir of 86.0% during sleep. The mean oxygen saturation during the study was 94.5%. The cumulative time under 88% oxygen saturation was 5.5 minutes.  LEG MOVEMENT DATA  The total leg movements were 35 with a resulting leg movement index of 6.0/hr . Associated arousal with leg movement index was 0.7/hr.   CARDIAC DATA  The underlying cardiac rhythm was most consistent with sinus rhythm. Mean heart rate during sleep was 67.5 bpm. Additional rhythm abnormalities include None.   IMPRESSIONS   Mild Obstructive Sleep apnea(OSA)   No Significant Central Sleep Apnea (CSA)  Few limb movements during sleep. However, no significant associated arousals.  DIAGNOSIS   Obstructive Sleep Apnea (327.23 [G47.33 ICD-10])  RECOMMENDATIONS   Very mild obstructive sleep apnea.   Treatment decisions will be left to the referring provider  Marius Ditch Sleep specialist, American Board of Internal Medicine  ELECTRONICALLY SIGNED ON:  12/12/2017, 4:11 PM [place] PH: [phone]   FX: [phone] [place]

## 2018-03-02 DIAGNOSIS — G4733 Obstructive sleep apnea (adult) (pediatric): Secondary | ICD-10-CM | POA: Diagnosis not present

## 2018-05-16 DIAGNOSIS — M1712 Unilateral primary osteoarthritis, left knee: Secondary | ICD-10-CM | POA: Diagnosis not present

## 2018-05-16 DIAGNOSIS — M25562 Pain in left knee: Secondary | ICD-10-CM | POA: Diagnosis not present

## 2018-05-24 DIAGNOSIS — M1712 Unilateral primary osteoarthritis, left knee: Secondary | ICD-10-CM | POA: Diagnosis not present

## 2018-05-31 DIAGNOSIS — M1712 Unilateral primary osteoarthritis, left knee: Secondary | ICD-10-CM | POA: Diagnosis not present

## 2018-06-08 DIAGNOSIS — M1712 Unilateral primary osteoarthritis, left knee: Secondary | ICD-10-CM | POA: Diagnosis not present

## 2018-08-23 DIAGNOSIS — R0602 Shortness of breath: Secondary | ICD-10-CM | POA: Diagnosis not present

## 2018-08-23 DIAGNOSIS — E119 Type 2 diabetes mellitus without complications: Secondary | ICD-10-CM | POA: Diagnosis not present

## 2018-08-23 DIAGNOSIS — E7800 Pure hypercholesterolemia, unspecified: Secondary | ICD-10-CM | POA: Diagnosis not present

## 2018-08-26 DIAGNOSIS — E7800 Pure hypercholesterolemia, unspecified: Secondary | ICD-10-CM | POA: Diagnosis not present

## 2018-08-26 DIAGNOSIS — E119 Type 2 diabetes mellitus without complications: Secondary | ICD-10-CM | POA: Diagnosis not present

## 2019-01-16 DIAGNOSIS — M25562 Pain in left knee: Secondary | ICD-10-CM | POA: Diagnosis not present

## 2019-01-16 DIAGNOSIS — M1712 Unilateral primary osteoarthritis, left knee: Secondary | ICD-10-CM | POA: Diagnosis not present

## 2019-01-24 DIAGNOSIS — M1712 Unilateral primary osteoarthritis, left knee: Secondary | ICD-10-CM | POA: Diagnosis not present

## 2019-01-31 DIAGNOSIS — M1712 Unilateral primary osteoarthritis, left knee: Secondary | ICD-10-CM | POA: Diagnosis not present

## 2019-01-31 DIAGNOSIS — M25562 Pain in left knee: Secondary | ICD-10-CM | POA: Diagnosis not present

## 2019-05-29 DIAGNOSIS — G4733 Obstructive sleep apnea (adult) (pediatric): Secondary | ICD-10-CM | POA: Diagnosis not present

## 2019-05-29 DIAGNOSIS — E119 Type 2 diabetes mellitus without complications: Secondary | ICD-10-CM | POA: Diagnosis not present

## 2019-06-23 DIAGNOSIS — Z20828 Contact with and (suspected) exposure to other viral communicable diseases: Secondary | ICD-10-CM | POA: Diagnosis not present

## 2019-07-01 DIAGNOSIS — Z20828 Contact with and (suspected) exposure to other viral communicable diseases: Secondary | ICD-10-CM | POA: Diagnosis not present

## 2019-09-02 DIAGNOSIS — Z20828 Contact with and (suspected) exposure to other viral communicable diseases: Secondary | ICD-10-CM | POA: Diagnosis not present

## 2019-09-19 DIAGNOSIS — G4733 Obstructive sleep apnea (adult) (pediatric): Secondary | ICD-10-CM | POA: Diagnosis not present

## 2019-09-19 DIAGNOSIS — R600 Localized edema: Secondary | ICD-10-CM | POA: Diagnosis not present

## 2019-09-19 DIAGNOSIS — Z9884 Bariatric surgery status: Secondary | ICD-10-CM | POA: Diagnosis not present

## 2019-09-19 DIAGNOSIS — E7800 Pure hypercholesterolemia, unspecified: Secondary | ICD-10-CM | POA: Diagnosis not present

## 2019-09-19 DIAGNOSIS — E1169 Type 2 diabetes mellitus with other specified complication: Secondary | ICD-10-CM | POA: Diagnosis not present

## 2019-10-12 ENCOUNTER — Ambulatory Visit: Payer: Medicare Other | Attending: Family Medicine | Admitting: Physical Therapy

## 2019-10-12 ENCOUNTER — Other Ambulatory Visit: Payer: Self-pay

## 2019-10-12 ENCOUNTER — Encounter: Payer: Self-pay | Admitting: Physical Therapy

## 2019-10-12 DIAGNOSIS — M25661 Stiffness of right knee, not elsewhere classified: Secondary | ICD-10-CM | POA: Insufficient documentation

## 2019-10-12 DIAGNOSIS — M6281 Muscle weakness (generalized): Secondary | ICD-10-CM | POA: Diagnosis present

## 2019-10-12 DIAGNOSIS — M25561 Pain in right knee: Secondary | ICD-10-CM | POA: Diagnosis not present
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
## 2019-10-12 NOTE — Patient Instructions (Addendum)
Access Code: [email or web address]  URL: [email or web address]  Date: 10/12/2019  Prepared by: Ruben Im   Exercises Supine Heel Slides - 10 reps - 1 sets - 1x daily - 7x weekly Supine Quad Set - 10 reps - 1 sets - 1x daily - 7x weekly Active Straight Leg Raise with Quad Set - 10 reps - 1 sets - 1x daily - 7x weekly      [facility] Welcome to [facility]! Here you will find all the information you will need regarding your pool therapy. If you have further questions at any time, please call our office at [phone]. After completing your initial evaluation in the [place] clinic, you may be eligible to complete a portion of your therapy in the pool. A typical week of therapy will consist of 1-2 typical physical therapy visits at our [place] location and an additional session of therapy in the pool located at the [facility] at [place], [place], [place] [address].  Aquatic therapy will be offered on Friday afternoons. Each session will last approximately 30 minutes. All scheduling and payments for aquatic therapy sessions, including cancelations, will be done through our [place] location.  To be eligible for aquatic therapy, these criteria must be met: . You must be able to independently change in the locker room and get to the pool deck. A caregiver can come with you to help if needed. There are bleachers for a caregiver to sit on next to the pool. . No one with an open wound is permitted in the pool.  Handicap parking is available in the front and there is a drop off option for even closer accessibility. Please arrive 15 minutes prior to your appointment to prepare for your pool session. You must sign in at the front desk upon your arrival. Please be sure to attend to any toileting needs prior to entering the pool. Pillow rooms for changing are located to the right of the check-in desk. There is direct access to  the pool deck from the locker room. You can lock your belongings in a locker but must bring a lock. Your therapist will greet you on the pool deck. There may be other swimmers in the pool at the same time but your session is one-on-one with the therapist.

## 2019-10-13 ENCOUNTER — Encounter: Payer: Self-pay | Admitting: Physical Therapy

## 2019-10-13 ENCOUNTER — Other Ambulatory Visit: Payer: Self-pay

## 2019-10-13 ENCOUNTER — Ambulatory Visit: Payer: Medicare Other | Admitting: Physical Therapy

## 2019-10-13 DIAGNOSIS — M25561 Pain in right knee: Secondary | ICD-10-CM | POA: Diagnosis not present

## 2019-10-13 DIAGNOSIS — M6281 Muscle weakness (generalized): Secondary | ICD-10-CM

## 2019-10-13 DIAGNOSIS — M25661 Stiffness of right knee, not elsewhere classified: Secondary | ICD-10-CM

## 2019-10-13 NOTE — Therapy (Signed)
[facility] [address] [place], [place], [address] Phone: [phone]   Fax:  [phone]  Physical Therapy Treatment  Patient Details  Name: Anthony Cox MRN: [id number] Date of Birth: 12/05/1990 Referring Provider (PT): Dr. Rip Harbour    Encounter Date: 10/13/2019  PT End of Session - 10/13/19 1608    Visit Number  2    Date for PT Re-Evaluation  12/07/19    Authorization Type  UHC Medicare;  10th visit progress note    PT Start Time  1230   Pt late   PT Stop Time  1300    PT Time Calculation (min)  30 min    Activity Tolerance  Patient limited by pain    Behavior During Therapy  [facility] for tasks assessed/performed       Past Medical History:  Diagnosis Date  . Anemia   . Blount's disease   . Diabetes mellitus without complication ([place])   . GERD (gastroesophageal reflux disease)   . Headache    comes and goes; pt states relates to OSA  . History of cellulitis    left leg   . History of strep sore throat   . Lower leg edema    left; comes and goes   . Morbid obesity ([place])   . OSA (obstructive sleep apnea)    noncompliant with cpap  . Shortness of breath dyspnea    on exertion     Past Surgical History:  Procedure Laterality Date  . BREATH TEK H PYLORI N/A 02/14/2015   Procedure: BREATH TEK H PYLORI;  Surgeon: Johnathan Hausen, MD;  Location: [facility];  Service: General;  Laterality: N/A;  . KNEE SURGERY     bilat;   . LAPAROSCOPIC GASTRIC SLEEVE RESECTION N/A 07/08/2015   Procedure: LAPAROSCOPIC GASTRIC SLEEVE RESECTION;  Surgeon: Johnathan Hausen, MD;  [facility];  Service: General;  Laterality: N/A;  . UPPER GI ENDOSCOPY N/A 07/08/2015   Procedure: UPPER GI ENDOSCOPY;  Surgeon: Johnathan Hausen, MD;  Location[facility];  Service: General;  Laterality: N/A;    There were no vitals filed for this visit.  Subjective Assessment - 10/13/19 1606    Subjective  Quite a lot of knee pain because I  was rushing to get here. RT > LT. Pt ambulating very slowly.    Pertinent History  Blount's disease resulting in 2 major surgeries on legs when 63 and 29 years old; right has staples and left has staples and a bar and mild OA in knees;  gastric sleeve surgery 2016;  previously worked out at [employer]    Currently in Pain?  Yes    Pain Score  7     Pain Location  Knee    Pain Orientation  Right;Left    Pain Descriptors / Indicators  Sore    Multiple Pain Sites  No       Treatment: Aquatic Session Pool temp 87.6 degrees. Pt entered pool very slowly using the long ramp and bil hand rails.   Seated with 75% submersion for pain assessment and brief education about the water principles. PTA educated pt on general LE AROM in sitting for ankle, knee, and hip. Pt performed all without adverse effects.  Attempted water walking with 2 noodles providing extra buoyancy and support for Bil LE. Pt ambulated 1/4 length of waist deep water ( pt is tall) in 4 directions. Pain with all walking. Decompression float 1 min post.  Standing hip pendulums:  VC to make motions small. 6x BIl. Pain was greatest if pt made too big motions. Standing heel raises 6x. Pt is about waist deep for any standing exercises. Pain with exercise.                          PT Short Term Goals - 10/12/19 2030      PT SHORT TERM GOAL #1   Title  The patient will demonstrate basic self care strategies and initial HEP to promote healing    Time  4    Period  Weeks    Status  New    Target Date  11/09/19      PT SHORT TERM GOAL #2   Title  The patient will report a 30% improvement in right knee pain with walking and standing for grocery shopping and short to medium community distances    Time  4    Period  Weeks    Status  New      PT SHORT TERM GOAL #3   Title  The patient will have improved right knee ROM 20-96 degrees for greater ease in/out of the car, rising and with up/down curbs    Time  4    Period   Weeks    Status  New      PT SHORT TERM GOAL #4   Title  FOTO functional outcome score improved from 53% to 43% limitation    Time  4    Period  Weeks    Status  New        PT Long Term Goals - 10/12/19 2033      PT LONG TERM GOAL #1   Title  The patient will be independent with safe self progression of HEP and return to gym program    Time  8    Period  Weeks    Status  New    Target Date  12/07/19      PT LONG TERM GOAL #2   Title  The patient will report a 60% improvement in right knee pain with home and work duties    Time  8    Period  Weeks    Status  New      PT LONG TERM GOAL #3   Title  The patient will have improved right knee ROM 10-105 degrees for greater ease with rising, stooping for work and negotiating steps/curbs    Time  8    Period  Weeks    Status  New      PT LONG TERM GOAL #4   Title  Right knee strength improved to 4/5 to 4+/5  needed for stooping and lifting at work    Time  8    Period  Weeks    Status  New      PT LONG TERM GOAL #5   Title  FOTO functional outcome score improved from 53% limitation to 31% limitation indicating improved function with less pain    Time  8    Period  Weeks    Status  New            Plan - 10/13/19 1609    Clinical Impression Statement  Today was first aquatic session for pt since initial eval yesterday. Pt verbally states compliance with HEP given by PT. He was late today by 15 min but this did not impact his therapy as 30 minutes in the pool was  about pt's tolerance. He was pretty limited by RT knee pain > LT knee. He required frequent breaks to get NWB and "take the pressure off" his knees.    Personal Factors and Comorbidities  Past/Current Experience;Comorbidity 1;Comorbidity 2;Comorbidity 3+;Profession    Comorbidities  morbid obesity; Blount's disease with previous knee surgeries; diabetes;  strenuous work    Air cabin crew;Other;Shop    Rehab Potential  Good    PT Frequency  2x / week    PT Duration  8 weeks    PT Treatment/Interventions  ADLs/Self Care Home Management;Aquatic Therapy;Cryotherapy;Electrical Stimulation;Moist Heat;Therapeutic exercise;Therapeutic activities;Stair training;Gait training;Neuromuscular re-education;Manual techniques;Patient/family education;Taping;Vasopneumatic Device    PT Next Visit Plan  See how pt tolerated the pool. If on land pt can probably tolerate the [place].    PT Home Exercise Plan  Access Code: [email or web address]    Consulted and Agree with Plan of Care  Patient       Patient will benefit from skilled therapeutic intervention in order to improve the following deficits and impairments:  Difficulty walking, Decreased range of motion, Obesity, Decreased activity tolerance, Impaired perceived functional ability, Pain, Decreased mobility, Decreased strength  Visit Diagnosis: Acute pain of right knee  Stiffness of right knee, not elsewhere classified  Muscle weakness (generalized)     Problem List Patient Active Problem List   Diagnosis Date Noted  . S/P laparoscopic sleeve gastrectomy Nov 2016 07/10/2015  . Morbid obesity with body mass index of 70 and over in adult [facility]) 07/08/2015  . Morbid obesity ([place]) 02/10/2013  . Cellulitis of leg, left 02/10/2013  . Blount's disease 02/09/2013    Emmagrace Runkel, PTA 10/13/2019, 4:14 PM  [facility] [address] [place], [place], [address] Phone: [phone]   Fax:  [phone]  Name: Anthony Cox MRN: [id number] Date of Birth: 1991-04-24

## 2019-10-18 ENCOUNTER — Ambulatory Visit: Payer: Medicare Other | Admitting: Physical Therapy

## 2019-10-18 ENCOUNTER — Encounter: Payer: Self-pay | Admitting: Physical Therapy

## 2019-10-18 ENCOUNTER — Other Ambulatory Visit: Payer: Self-pay

## 2019-10-18 DIAGNOSIS — M25561 Pain in right knee: Secondary | ICD-10-CM

## 2019-10-18 DIAGNOSIS — M6281 Muscle weakness (generalized): Secondary | ICD-10-CM

## 2019-10-18 DIAGNOSIS — M25661 Stiffness of right knee, not elsewhere classified: Secondary | ICD-10-CM

## 2019-10-18 NOTE — Therapy (Signed)
[facility] [address], [place], [place], [address] Phone: [phone]   Fax:  [phone]  Physical Therapy Treatment  Patient Details  Name: Anthony Cox MRN: [id number] Date of Birth: Jan 01, 1991 Referring Provider (PT): Dr. Rip Harbour    Encounter Date: 10/18/2019  PT End of Session - 10/18/19 1149    Visit Number  3    Date for PT Re-Evaluation  12/07/19    Authorization Type  [employer] Medicare;  10th visit progress note    PT Start Time  1147    PT Stop Time  1228    PT Time Calculation (min)  41 min    Activity Tolerance  Patient tolerated treatment well;Patient limited by fatigue    Behavior During Therapy  [facility] for tasks assessed/performed       Past Medical History:  Diagnosis Date  . Anemia   . Blount's disease   . Diabetes mellitus without complication ([place])   . GERD (gastroesophageal reflux disease)   . Headache    comes and goes; pt states relates to OSA  . History of cellulitis    left leg   . History of strep sore throat   . Lower leg edema    left; comes and goes   . Morbid obesity ([place])   . OSA (obstructive sleep apnea)    noncompliant with cpap  . Shortness of breath dyspnea    on exertion     Past Surgical History:  Procedure Laterality Date  . BREATH TEK H PYLORI N/A 02/14/2015   Procedure: BREATH TEK H PYLORI;  Surgeon: Johnathan Hausen, MD;  Location: [facility];  Service: General;  Laterality: N/A;  . KNEE SURGERY     bilat;   . LAPAROSCOPIC GASTRIC SLEEVE RESECTION N/A 07/08/2015   Procedure: LAPAROSCOPIC GASTRIC SLEEVE RESECTION;  Surgeon: Johnathan Hausen, MD;  Location: [facility];  Service: General;  Laterality: N/A;  . UPPER GI ENDOSCOPY N/A 07/08/2015   Procedure: UPPER GI ENDOSCOPY;  Surgeon: Johnathan Hausen, MD;  Location: [facility];  Service: General;  Laterality: N/A;    There were no vitals filed for this visit.  Subjective Assessment - 10/18/19 1150    Subjective  Pool was  ok, hard getting out when I hit gravity. Had cortisone shot in Rt knee on Monday.this helpled alot.    Pertinent History  Blount's disease resulting in 2 major surgeries on legs when 74 and 29 years old; right has staples and left has staples and a bar and mild OA in knees;  gastric sleeve surgery 2016;  previously worked out at [employer]    Currently in Pain?  No/denies    Multiple Pain Sites  No                       [facility] Adult PT Treatment/Exercise - 10/18/19 0001      Knee/Hip Exercises: Aerobic   Nustep  Seat 13, rotate UE arms outwards slighttly 3 min level 0      Knee/Hip Exercises: Seated   Ball Squeeze  10x 5 sec hold     Other Seated Knee/Hip Exercises  Using floor slider 20x Bil flexion/ext      Vasopneumatic   Number Minutes Vasopneumatic   15 minutes    Vasopnuematic Location   --   Bil knees, elevated in semi-reclined   Vasopneumatic Pressure  Low    Vasopneumatic Temperature   3 flakes  PT Short Term Goals - 10/12/19 2030      PT SHORT TERM GOAL #1   Title  The patient will demonstrate basic self care strategies and initial HEP to promote healing    Time  4    Period  Weeks    Status  New    Target Date  11/09/19      PT SHORT TERM GOAL #2   Title  The patient will report a 30% improvement in right knee pain with walking and standing for grocery shopping and short to medium community distances    Time  4    Period  Weeks    Status  New      PT SHORT TERM GOAL #3   Title  The patient will have improved right knee ROM 20-96 degrees for greater ease in/out of the car, rising and with up/down curbs    Time  4    Period  Weeks    Status  New      PT SHORT TERM GOAL #4   Title  FOTO functional outcome score improved from 53% to 43% limitation    Time  4    Period  Weeks    Status  New        PT Long Term Goals - 10/12/19 2033      PT LONG TERM GOAL #1   Title  The patient will be independent with safe self progression  of HEP and return to gym program    Time  8    Period  Weeks    Status  New    Target Date  12/07/19      PT LONG TERM GOAL #2   Title  The patient will report a 60% improvement in right knee pain with home and work duties    Time  8    Period  Weeks    Status  New      PT LONG TERM GOAL #3   Title  The patient will have improved right knee ROM 10-105 degrees for greater ease with rising, stooping for work and negotiating steps/curbs    Time  8    Period  Weeks    Status  New      PT LONG TERM GOAL #4   Title  Right knee strength improved to 4/5 to 4+/5  needed for stooping and lifting at work    Time  8    Period  Weeks    Status  New      PT LONG TERM GOAL #5   Title  FOTO functional outcome score improved from 53% limitation to 31% limitation indicating improved function with less pain    Time  8    Period  Weeks    Status  New            Plan - 10/18/19 1149    Clinical Impression Statement  Pt arrives today with no pain. He received a cortisone shot this past Monday in his Rt knee. This has really helped reduce his pain. pt reports he felt good with the pool session. The hardest part was getting out when gravity hit his legs and he had to sit for a good 10-15 before he could walk out of the facility. He tolerated about 30 minutes to exercises albeit slow, hip and quads were very shakey with the ball squueze activity. pt tolerated 3 minutes on the Nustep. After the Nustep pain was increasing slightly so we  decided to put both knees in t hegame ready for pain and edema reduction .    Personal Factors and Comorbidities  Past/Current Experience;Comorbidity 1;Comorbidity 2;Comorbidity 3+;Profession    Comorbidities  morbid obesity; Blount's disease with previous knee surgeries; diabetes;  strenuous work    Freight forwarder;Other;Shop     Stability/Clinical Decision Making  Evolving/Moderate complexity    Rehab Potential  Good    PT Frequency  2x / week    PT Duration  8 weeks    PT Treatment/Interventions  ADLs/Self Care Home Management;Aquatic Therapy;Cryotherapy;Electrical Stimulation;Moist Heat;Therapeutic exercise;Therapeutic activities;Stair training;Gait training;Neuromuscular re-education;Manual techniques;Patient/family education;Taping;Vasopneumatic Device    PT Next Visit Plan  Aquatic session next.    PT Home Exercise Plan  Access Code: [email or web address]    Consulted and Agree with Plan of Care  Patient       Patient will benefit from skilled therapeutic intervention in order to improve the following deficits and impairments:  Difficulty walking, Decreased range of motion, Obesity, Decreased activity tolerance, Impaired perceived functional ability, Pain, Decreased mobility, Decreased strength  Visit Diagnosis: Acute pain of right knee  Stiffness of right knee, not elsewhere classified  Muscle weakness (generalized)     Problem List Patient Active Problem List   Diagnosis Date Noted  . S/P laparoscopic sleeve gastrectomy Nov 2016 07/10/2015  . Morbid obesity with body mass index of 70 and over in adult [facility]) 07/08/2015  . Morbid obesity ([place]) 02/10/2013  . Cellulitis of leg, left 02/10/2013  . Blount's disease 02/09/2013    Alaura Schippers, PTA 10/18/2019, 12:30 PM  [facility] [address], [place], [place], [address] Phone: [phone]   Fax:  [phone]  Name: Anthony Cox MRN: [id number] Date of Birth: 1991/04/28

## 2019-10-20 ENCOUNTER — Encounter: Payer: Self-pay | Admitting: Physical Therapy

## 2019-10-20 ENCOUNTER — Ambulatory Visit: Payer: Medicare Other | Admitting: Physical Therapy

## 2019-10-20 ENCOUNTER — Other Ambulatory Visit: Payer: Self-pay

## 2019-10-20 DIAGNOSIS — M25661 Stiffness of right knee, not elsewhere classified: Secondary | ICD-10-CM

## 2019-10-20 DIAGNOSIS — M25561 Pain in right knee: Secondary | ICD-10-CM

## 2019-10-20 DIAGNOSIS — M6281 Muscle weakness (generalized): Secondary | ICD-10-CM

## 2019-10-20 NOTE — Therapy (Signed)
[facility] [address] [place], [place], [address] Phone: [phone]   Fax:  [phone]  Physical Therapy Treatment  Patient Details  Name: Anthony Cox MRN: [id number] Date of Birth: [birth year] Referring Provider (PT): Dr. Rip Harbour    Encounter Date: 10/20/2019  PT End of Session - 10/20/19 1643    Visit Number  4    Date for PT Re-Evaluation  12/07/19    Authorization Type  [employer] Medicare;  10th visit progress note    PT Start Time  1310   Pt late   PT Stop Time  1345    PT Time Calculation (min)  35 min    Activity Tolerance  Patient limited by pain    Behavior During Therapy  [facility] for tasks assessed/performed       Past Medical History:  Diagnosis Date  . Anemia   . Blount's disease   . Diabetes mellitus without complication ([place])   . GERD (gastroesophageal reflux disease)   . Headache    comes and goes; pt states relates to OSA  . History of cellulitis    left leg   . History of strep sore throat   . Lower leg edema    left; comes and goes   . Morbid obesity ([place])   . OSA (obstructive sleep apnea)    noncompliant with cpap  . Shortness of breath dyspnea    on exertion     Past Surgical History:  Procedure Laterality Date  . BREATH TEK H PYLORI N/A 02/14/2015   Procedure: BREATH TEK H PYLORI;  Surgeon: Johnathan Hausen, MD;  Location: [facility];  Service: General;  Laterality: N/A;  . KNEE SURGERY     bilat;   . LAPAROSCOPIC GASTRIC SLEEVE RESECTION N/A 07/08/2015   Procedure: LAPAROSCOPIC GASTRIC SLEEVE RESECTION;  Surgeon: Johnathan Hausen, MD;  [facility];  Service: General;  Laterality: N/A;  . UPPER GI ENDOSCOPY N/A 07/08/2015   Procedure: UPPER GI ENDOSCOPY;  Surgeon: Johnathan Hausen, MD;  [facility];  Service: General;  Laterality: N/A;    There were no vitals filed for this visit.  Subjective Assessment - 10/20/19 1642    Subjective  I did ok after last session in the  clinic. RT knee continues to hurt and buckle    Pertinent History  Blount's disease resulting in 2 major surgeries on legs when 15 and 29 years old; right has staples and left has staples and a bar and mild OA in knees;  gastric sleeve surgery 2016;  previously worked out at [employer]    Currently in Pain?  Yes   RT knee 6/10, LT knee 5/10   Pain Descriptors / Indicators  Sore    Aggravating Factors   Standing and walking    Pain Relieving Factors  Sitting    Multiple Pain Sites  No       Treatment: Aquatic session. Water temp 87.6 degrees F Pt entered the water via the long ramp very slowly and cautiously.   Seated at underwater bench:  Ankle AROM in all planes 20x, KNee ext/flex Bil 2x10 with VC to let water gently move the knee. Pain assessment.  Pt performed the rest of his session 75%-80% submersion., about 4.5 ft of water.  Heel raises: 2x10 Hip abduction Bil: 10x Forward walking: 1 length of pool Decompression float for LE in corner of lane and pool wall. 5 min  PT Short Term Goals - 10/12/19 2030      PT SHORT TERM GOAL #1   Title  The patient will demonstrate basic self care strategies and initial HEP to promote healing    Time  4    Period  Weeks    Status  New    Target Date  11/09/19      PT SHORT TERM GOAL #2   Title  The patient will report a 30% improvement in right knee pain with walking and standing for grocery shopping and short to medium community distances    Time  4    Period  Weeks    Status  New      PT SHORT TERM GOAL #3   Title  The patient will have improved right knee ROM 20-96 degrees for greater ease in/out of the car, rising and with up/down curbs    Time  4    Period  Weeks    Status  New      PT SHORT TERM GOAL #4   Title  FOTO functional outcome score improved from 53% to 43% limitation    Time  4    Period  Weeks    Status  New        PT Long Term Goals - 10/12/19 2033      PT LONG TERM  GOAL #1   Title  The patient will be independent with safe self progression of HEP and return to gym program    Time  8    Period  Weeks    Status  New    Target Date  12/07/19      PT LONG TERM GOAL #2   Title  The patient will report a 60% improvement in right knee pain with home and work duties    Time  8    Period  Weeks    Status  New      PT LONG TERM GOAL #3   Title  The patient will have improved right knee ROM 10-105 degrees for greater ease with rising, stooping for work and negotiating steps/curbs    Time  8    Period  Weeks    Status  New      PT LONG TERM GOAL #4   Title  Right knee strength improved to 4/5 to 4+/5  needed for stooping and lifting at work    Time  8    Period  Weeks    Status  New      PT LONG TERM GOAL #5   Title  FOTO functional outcome score improved from 53% limitation to 31% limitation indicating improved function with less pain    Time  8    Period  Weeks    Status  New            Plan - 10/20/19 1644    Clinical Impression Statement  Pt arrives late and ambulating very slowly on the pool deck. He reports bil knee pain, RT >LT. He states he tolerated last session on land well as well as the last pool session. Pt's Rt knee visually buckled 2-3x during his session, he denied pain throughout but facial expressions do not match. He tolerates a low level of movement in general, even in the pool. Pt was able to perform all his exercises in chest height  water. Movements continue to be slow and cautious, letting the buoyancy of the water assist his movements.  Personal Factors and Comorbidities  Past/Current Experience;Comorbidity 1;Comorbidity 2;Comorbidity 3+;Profession    Examination-Activity Limitations  Locomotion Level;Carry;Squat;Stairs;Lift;Stand    Examination-Participation Restrictions  Community Activity;Driving;Other;Shop    Stability/Clinical Decision Making  Evolving/Moderate complexity    Rehab Potential  Good    PT Frequency   2x / week    PT Treatment/Interventions  ADLs/Self Care Home Management;Aquatic Therapy;Cryotherapy;Electrical Stimulation;Moist Heat;Therapeutic exercise;Therapeutic activities;Stair training;Gait training;Neuromuscular re-education;Manual techniques;Patient/family education;Taping;Vasopneumatic Device    PT Next Visit Plan  Aquatic session next.    PT Home Exercise Plan  Access Code: [email or web address]    Consulted and Agree with Plan of Care  Patient       Patient will benefit from skilled therapeutic intervention in order to improve the following deficits and impairments:  Difficulty walking, Decreased range of motion, Obesity, Decreased activity tolerance, Impaired perceived functional ability, Pain, Decreased mobility, Decreased strength  Visit Diagnosis: Acute pain of right knee  Stiffness of right knee, not elsewhere classified  Muscle weakness (generalized)     Problem List Patient Active Problem List   Diagnosis Date Noted  . S/P laparoscopic sleeve gastrectomy Nov 2016 07/10/2015  . Morbid obesity with body mass index of 70 and over in adult [facility]) 07/08/2015  . Morbid obesity ([place]) 02/10/2013  . Cellulitis of leg, left 02/10/2013  . Blount's disease 02/09/2013    Mikyle Sox, PTA 10/20/2019, 5:25 PM  [facility] [address], [place], [place], [address] Phone: [phone]   Fax:  [phone]  Name: Anthony Cox MRN: [id number] Date of Birth: Mar 06, 1991

## 2019-10-25 ENCOUNTER — Encounter: Payer: Self-pay | Admitting: Physical Therapy

## 2019-10-25 ENCOUNTER — Other Ambulatory Visit: Payer: Self-pay

## 2019-10-25 ENCOUNTER — Ambulatory Visit: Payer: Medicare Other | Admitting: Physical Therapy

## 2019-10-25 DIAGNOSIS — M25561 Pain in right knee: Secondary | ICD-10-CM

## 2019-10-25 DIAGNOSIS — M6281 Muscle weakness (generalized): Secondary | ICD-10-CM

## 2019-10-25 DIAGNOSIS — M25661 Stiffness of right knee, not elsewhere classified: Secondary | ICD-10-CM

## 2019-10-25 NOTE — Therapy (Signed)
[facility] [address], [place], [place], [address] Phone: [phone]   Fax:  [phone]  Physical Therapy Treatment  Patient Details  Name: Anthony Cox MRN: [id number] Date of Birth: October 27, 1990 Referring Provider (PT): Dr. Rip Harbour    Encounter Date: 10/25/2019  PT End of Session - 10/25/19 0938    Visit Number  5    Date for PT Re-Evaluation  12/07/19    Authorization Type  [employer] Medicare;  10th visit progress note    PT Start Time  0938    PT Stop Time  1015    PT Time Calculation (min)  37 min    Activity Tolerance  Patient limited by pain    Behavior During Therapy  [facility] for tasks assessed/performed       Past Medical History:  Diagnosis Date  . Anemia   . Blount's disease   . Diabetes mellitus without complication ([place])   . GERD (gastroesophageal reflux disease)   . Headache    comes and goes; pt states relates to OSA  . History of cellulitis    left leg   . History of strep sore throat   . Lower leg edema    left; comes and goes   . Morbid obesity ([place])   . OSA (obstructive sleep apnea)    noncompliant with cpap  . Shortness of breath dyspnea    on exertion     Past Surgical History:  Procedure Laterality Date  . BREATH TEK H PYLORI N/A 02/14/2015   Procedure: BREATH TEK H PYLORI;  Surgeon: Johnathan Hausen, MD;  Location: [facility];  Service: General;  Laterality: N/A;  . KNEE SURGERY     bilat;   . LAPAROSCOPIC GASTRIC SLEEVE RESECTION N/A 07/08/2015   Procedure: LAPAROSCOPIC GASTRIC SLEEVE RESECTION;  Surgeon: Johnathan Hausen, MD;  [facility];  Service: General;  Laterality: N/A;  . UPPER GI ENDOSCOPY N/A 07/08/2015   Procedure: UPPER GI ENDOSCOPY;  Surgeon: Johnathan Hausen, MD;  [facility];  Service: General;  Laterality: N/A;    There were no vitals filed for this visit.  Subjective Assessment - 10/25/19 0939    Subjective  I did not feel my best after the pool on  Friday.    Pertinent History  Blount's disease resulting in 2 major surgeries on legs when 51 and 29 years old; right has staples and left has staples and a bar and mild OA in knees;  gastric sleeve surgery 2016;  previously worked out at [employer]    Currently in Pain?  Yes   RT knee 0/10, Lt 4/10.   Pain Location  Knee    Pain Orientation  Left    Multiple Pain Sites  No                       [facility] Adult PT Treatment/Exercise - 10/25/19 0001      Knee/Hip Exercises: Aerobic   Nustep  Seat 13, rotate UE arms outwards slighttly 5 min level 1      Knee/Hip Exercises: Seated   Long Arc Quad  AROM;Strengthening;Both;1 set;10 reps    Ball Squeeze  10x 5 sec hold     Other Seated Knee/Hip Exercises  Using floor slider 20x Bil flexion/ext      Vasopneumatic   Number Minutes Vasopneumatic   15 minutes    Vasopnuematic Location   --   LT knee, LE elevated   Vasopneumatic Pressure  Low    Vasopneumatic Temperature   3 flakes               PT Short Term Goals - 10/12/19 2030      PT SHORT TERM GOAL #1   Title  The patient will demonstrate basic self care strategies and initial HEP to promote healing    Time  4    Period  Weeks    Status  New    Target Date  11/09/19      PT SHORT TERM GOAL #2   Title  The patient will report a 30% improvement in right knee pain with walking and standing for grocery shopping and short to medium community distances    Time  4    Period  Weeks    Status  New      PT SHORT TERM GOAL #3   Title  The patient will have improved right knee ROM 20-96 degrees for greater ease in/out of the car, rising and with up/down curbs    Time  4    Period  Weeks    Status  New      PT SHORT TERM GOAL #4   Title  FOTO functional outcome score improved from 53% to 43% limitation    Time  4    Period  Weeks    Status  New        PT Long Term Goals - 10/12/19 2033      PT LONG TERM GOAL #1   Title  The patient will be independent with  safe self progression of HEP and return to gym program    Time  8    Period  Weeks    Status  New    Target Date  12/07/19      PT LONG TERM GOAL #2   Title  The patient will report a 60% improvement in right knee pain with home and work duties    Time  8    Period  Weeks    Status  New      PT LONG TERM GOAL #3   Title  The patient will have improved right knee ROM 10-105 degrees for greater ease with rising, stooping for work and negotiating steps/curbs    Time  8    Period  Weeks    Status  New      PT LONG TERM GOAL #4   Title  Right knee strength improved to 4/5 to 4+/5  needed for stooping and lifting at work    Time  8    Period  Weeks    Status  New      PT LONG TERM GOAL #5   Title  FOTO functional outcome score improved from 53% limitation to 31% limitation indicating improved function with less pain    Time  8    Period  Weeks    Status  New            Plan - 10/25/19 0958    Clinical Impression Statement  Pt reports conlficting information regarding the pool: he feels he moves better sometime after but right after he gets out he feels "horrible." PTA suggested he may be acutely aware of teh gravity when he leaves the pool and that this is very normal. Pain numbers down today significantly. PTA quizzed pt on his edema if he thought his knees still felt swollen or not, pt was not able to answer the question. Lt  knee appears more swollen today vs the the RT. Cortisone shot may have greatly helped the Rt knee. Pt was able to tolerate double his time on the Nustep today and could perform a full range LAQ without pain. Pt still tolerates low level of work.    Personal Factors and Comorbidities  Past/Current Experience;Comorbidity 1;Comorbidity 2;Comorbidity 3+;Profession    Comorbidities  morbid obesity; Blount's disease with previous knee surgeries; diabetes;  strenuous work    Air cabin crew;Other;Shop    Stability/Clinical Decision Making  Evolving/Moderate complexity    Rehab Potential  Good    PT Frequency  2x / week    PT Duration  8 weeks    PT Treatment/Interventions  ADLs/Self Care Home Management;Aquatic Therapy;Cryotherapy;Electrical Stimulation;Moist Heat;Therapeutic exercise;Therapeutic activities;Stair training;Gait training;Neuromuscular re-education;Manual techniques;Patient/family education;Taping;Vasopneumatic Device    PT Next Visit Plan  Pool is closed this week so no aquatics. Measure ROM next session for goals, Nustep, try adding functional closed chain exercises. Pt may not require Game ready if ROM measurements indicate edema reduction.    PT Home Exercise Plan  Access Code: [email or web address]    Consulted and Agree with Plan of Care  Patient       Patient will benefit from skilled therapeutic intervention in order to improve the following deficits and impairments:  Difficulty walking, Decreased range of motion, Obesity, Decreased activity tolerance, Impaired perceived functional ability, Pain, Decreased mobility, Decreased strength  Visit Diagnosis: Acute pain of right knee  Stiffness of right knee, not elsewhere classified  Muscle weakness (generalized)     Problem List Patient Active Problem List   Diagnosis Date Noted  . S/P laparoscopic sleeve gastrectomy Nov 2016 07/10/2015  . Morbid obesity with body mass index of 70 and over in adult [facility]) 07/08/2015  . Morbid obesity ([place]) 02/10/2013  . Cellulitis of leg, left 02/10/2013  . Blount's disease 02/09/2013    Grisela Mesch, PTA 10/25/2019, 10:12 AM  [facility] [address], [place], [place], [address] Phone: [phone]   Fax:  [phone]  Name: Kentre Overmiller MRN: [id number] Date of Birth: 06-25-1991

## 2019-10-27 ENCOUNTER — Encounter: Payer: Medicare Other | Admitting: Physical Therapy

## 2019-10-31 ENCOUNTER — Ambulatory Visit: Payer: Medicare Other | Admitting: Physical Therapy

## 2019-11-02 ENCOUNTER — Other Ambulatory Visit: Payer: Self-pay

## 2019-11-02 ENCOUNTER — Ambulatory Visit: Payer: Medicare Other | Admitting: Physical Therapy

## 2019-11-02 DIAGNOSIS — M25661 Stiffness of right knee, not elsewhere classified: Secondary | ICD-10-CM

## 2019-11-02 DIAGNOSIS — M25561 Pain in right knee: Secondary | ICD-10-CM | POA: Diagnosis not present

## 2019-11-02 DIAGNOSIS — M6281 Muscle weakness (generalized): Secondary | ICD-10-CM

## 2019-11-02 NOTE — Therapy (Signed)
[facility] [address], [place], [place], [address] Phone: [phone]   Fax:  [phone]  Physical Therapy Treatment  Patient Details  Name: Anthony Cox MRN: [id number] Date of Birth: [birth year] Referring Provider (PT): Dr. Rip Harbour    Encounter Date: 11/02/2019  PT End of Session - 11/02/19 0935    Visit Number  6    Date for PT Re-Evaluation  12/07/19    Authorization Type  [employer] Medicare;  10th visit progress note    PT Start Time  0855   pt late   PT Stop Time  0930    PT Time Calculation (min)  35 min       Past Medical History:  Diagnosis Date  . Anemia   . Blount's disease   . Diabetes mellitus without complication ([place])   . GERD (gastroesophageal reflux disease)   . Headache    comes and goes; pt states relates to OSA  . History of cellulitis    left leg   . History of strep sore throat   . Lower leg edema    left; comes and goes   . Morbid obesity ([place])   . OSA (obstructive sleep apnea)    noncompliant with cpap  . Shortness of breath dyspnea    on exertion     Past Surgical History:  Procedure Laterality Date  . BREATH TEK H PYLORI N/A 02/14/2015   Procedure: BREATH TEK H PYLORI;  Surgeon: Johnathan Hausen, MD;  Location: [facility];  Service: General;  Laterality: N/A;  . KNEE SURGERY     bilat;   . LAPAROSCOPIC GASTRIC SLEEVE RESECTION N/A 07/08/2015   Procedure: LAPAROSCOPIC GASTRIC SLEEVE RESECTION;  Surgeon: Johnathan Hausen, MD;  [facility]: [facility];  Service: General;  Laterality: N/A;  . UPPER GI ENDOSCOPY N/A 07/08/2015   Procedure: UPPER GI ENDOSCOPY;  Surgeon: Johnathan Hausen, MD;  Location: [facility];  Service: General;  Laterality: N/A;    There were no vitals filed for this visit.  Subjective Assessment - 11/02/19 0859    Subjective  No pain right now.  My knees are OK as long as I'm not moving.  I was in the store for 30 minutes and I got very tired in my legs.  Recovered  after 5-10 minutes of sitting down.  They said I could work light duty but there is no light duty.  I haven't gone back to [employer] to see furniture.    Pertinent History  Blount's disease resulting in 2 major surgeries on legs when 41 and 29 years old; right has staples and left has staples and a bar and mild OA in knees;  gastric sleeve surgery 2016;  previously worked out at [employer]    Currently in Pain?  No/denies    Pain Score  0-No pain    Pain Location  Knee    Pain Orientation  Left;Right         [facility] PT Assessment - 11/02/19 0001      AROM   Right Knee Extension  20    Right Knee Flexion  90    Left Knee Extension  15    Left Knee Flexion  110                   [facility] Adult PT Treatment/Exercise - 11/02/19 0001      Knee/Hip Exercises: Aerobic   Nustep  Seat 13, rotate UE arms outwards slighttly 6 min level  1      Knee/Hip Exercises: Standing   Hip Abduction  AROM;Right;Left;5 reps    Hip Extension  AROM;Right;Left;5 reps    Other Standing Knee Exercises  side to side weight shifting 45 sec    Other Standing Knee Exercises  front to back weight shifting 30 sec each side       Knee/Hip Exercises: Seated   Long Arc Quad  AROM;Strengthening;Both;1 set;5 reps    Other Seated Knee/Hip Exercises  Using floor slider 20x Bil flexion/ext    Marching  AROM;Right;Left;5 reps    Sit to Sand  5 reps   mat table/ UE assist               PT Short Term Goals - 10/12/19 2030      PT SHORT TERM GOAL #1   Title  The patient will demonstrate basic self care strategies and initial HEP to promote healing    Time  4    Period  Weeks    Status  New    Target Date  11/09/19      PT SHORT TERM GOAL #2   Title  The patient will report a 30% improvement in right knee pain with walking and standing for grocery shopping and short to medium community distances    Time  4    Period  Weeks    Status  New      PT SHORT TERM GOAL #3   Title  The patient will have  improved right knee ROM 20-96 degrees for greater ease in/out of the car, rising and with up/down curbs    Time  4    Period  Weeks    Status  New      PT SHORT TERM GOAL #4   Title  FOTO functional outcome score improved from 53% to 43% limitation    Time  4    Period  Weeks    Status  New        PT Long Term Goals - 10/12/19 2033      PT LONG TERM GOAL #1   Title  The patient will be independent with safe self progression of HEP and return to gym program    Time  8    Period  Weeks    Status  New    Target Date  12/07/19      PT LONG TERM GOAL #2   Title  The patient will report a 60% improvement in right knee pain with home and work duties    Time  8    Period  Weeks    Status  New      PT LONG TERM GOAL #3   Title  The patient will have improved right knee ROM 10-105 degrees for greater ease with rising, stooping for work and negotiating steps/curbs    Time  8    Period  Weeks    Status  New      PT LONG TERM GOAL #4   Title  Right knee strength improved to 4/5 to 4+/5  needed for stooping and lifting at work    Time  8    Period  Weeks    Status  New      PT LONG TERM GOAL #5   Title  FOTO functional outcome score improved from 53% limitation to 31% limitation indicating improved function with less pain    Time  8    Period  Weeks  Status  New            Plan - 11/02/19 [id number]    Clinical Impression Statement  The patient reports generally feeling tired today but not painful.  He is able to progress to limited standing ex's today but he fatigues rather quickly even with significant upper body assist.  He reports popping and cracking in his joints but states this is typical.  He reports he generally recovers quickly after exercise and walking within 5-10 minutes of sitting.   He states he prefers exercising in the water.  Therapist monitoring response with all treatment interventions.    Comorbidities  morbid obesity; Blount's disease with previous knee  surgeries; diabetes;  strenuous work    Rehab Potential  Good    PT Frequency  2x / week    PT Duration  8 weeks    PT Treatment/Interventions  ADLs/Self Care Home Management;Aquatic Therapy;Cryotherapy;Electrical Stimulation;Moist Heat;Therapeutic exercise;Therapeutic activities;Stair training;Gait training;Neuromuscular re-education;Manual techniques;Patient/family education;Taping;Vasopneumatic Device    PT Next Visit Plan  aquatic PT;  combo seated and standing ex's; sit to stand from mat table;  Nu-Step    PT Home Exercise Plan  Access Code: [email or web address]       Patient will benefit from skilled therapeutic intervention in order to improve the following deficits and impairments:  Difficulty walking, Decreased range of motion, Obesity, Decreased activity tolerance, Impaired perceived functional ability, Pain, Decreased mobility, Decreased strength  Visit Diagnosis: Acute pain of right knee  Stiffness of right knee, not elsewhere classified  Muscle weakness (generalized)     Problem List Patient Active Problem List   Diagnosis Date Noted  . S/P laparoscopic sleeve gastrectomy Nov 2016 07/10/2015  . Morbid obesity with body mass index of 70 and over in adult [facility]) 07/08/2015  . Morbid obesity ([place]) 02/10/2013  . Cellulitis of leg, left 02/10/2013  . Blount's disease 02/09/2013   Ruben Im, PT 11/02/19 5:43 PM Phone: [phone] Fax: [phone] Alvera Singh 11/02/2019, 5:42 PM  [facility] [address], [place], [place], [address] Phone: [phone]   Fax:  [phone]  Name: Honor Bertino MRN: [id number] Date of Birth: 1990-09-30

## 2019-11-08 ENCOUNTER — Encounter: Payer: Self-pay | Admitting: Physical Therapy

## 2019-11-08 ENCOUNTER — Other Ambulatory Visit: Payer: Self-pay

## 2019-11-08 ENCOUNTER — Ambulatory Visit: Payer: Medicare Other | Admitting: Physical Therapy

## 2019-11-08 DIAGNOSIS — M25561 Pain in right knee: Secondary | ICD-10-CM

## 2019-11-08 DIAGNOSIS — M6281 Muscle weakness (generalized): Secondary | ICD-10-CM

## 2019-11-08 DIAGNOSIS — M25661 Stiffness of right knee, not elsewhere classified: Secondary | ICD-10-CM

## 2019-11-08 NOTE — Therapy (Signed)
[facility] [address], [place], [place], [address] Phone: [phone]   Fax:  [phone]  Physical Therapy Treatment  Patient Details  Name: Anthony Cox MRN: [id number] Date of Birth: 1991/08/01 Referring Provider (PT): Dr. Rip Harbour    Encounter Date: 11/08/2019  PT End of Session - 11/08/19 0946    Visit Number  7    Date for PT Re-Evaluation  12/07/19    Authorization Type  [employer]HC Medicare;  10th visit progress note    PT Start Time  0945   Pt 15 min late   PT Stop Time  1015    PT Time Calculation (min)  30 min    Activity Tolerance  Patient tolerated treatment well;Patient limited by fatigue    Behavior During Therapy  [facility] for tasks assessed/performed       Past Medical History:  Diagnosis Date  . Anemia   . Blount's disease   . Diabetes mellitus without complication ([place])   . GERD (gastroesophageal reflux disease)   . Headache    comes and goes; pt states relates to OSA  . History of cellulitis    left leg   . History of strep sore throat   . Lower leg edema    left; comes and goes   . Morbid obesity ([place])   . OSA (obstructive sleep apnea)    noncompliant with cpap  . Shortness of breath dyspnea    on exertion     Past Surgical History:  Procedure Laterality Date  . BREATH TEK H PYLORI N/A 02/14/2015   Procedure: BREATH TEK H PYLORI;  Surgeon: Johnathan Hausen, MD;  Location: [facility];  Service: General;  Laterality: N/A;  . KNEE SURGERY     bilat;   . LAPAROSCOPIC GASTRIC SLEEVE RESECTION N/A 07/08/2015   Procedure: LAPAROSCOPIC GASTRIC SLEEVE RESECTION;  Surgeon: Johnathan Hausen, MD;  [facility];  Service: General;  Laterality: N/A;  . UPPER GI ENDOSCOPY N/A 07/08/2015   Procedure: UPPER GI ENDOSCOPY;  Surgeon: Johnathan Hausen, MD;  [facility];  Service: General;  Laterality: N/A;    There were no vitals filed for this visit.  Subjective Assessment - 11/08/19 0948    Subjective  I have back pain this AM that is more limiting than knee pain.    Pertinent History  Blount's disease resulting in 2 major surgeries on legs when 22 and 29 years old; right has staples and left has staples and a bar and mild OA in knees;  gastric sleeve surgery 2016;  previously worked out at [employer]    Currently in Pain?  Yes   back pain 8/10   Pain Descriptors / Indicators  Sore    Aggravating Factors   Not taking my medicine    Pain Relieving Factors  Meds    Multiple Pain Sites  No                       [facility] Adult PT Treatment/Exercise - 11/08/19 0001      Knee/Hip Exercises: Aerobic   Nustep  LE only: seat 12, L1 slow x 6 min      Knee/Hip Exercises: Standing   Hip Flexion  AROM;Stengthening;Both;1 set;10 reps;Knee bent    Hip Abduction  AROM;Stengthening;Both;1 set;10 reps;Knee straight    Hip Extension  AROM;Stengthening;Both;1 set;10 reps;Knee straight      Knee/Hip Exercises: Seated   Long Arc Quad  AROM;Strengthening;Both;1 set;10 reps   with  adductor squeeze/ball   Other Seated Knee/Hip Exercises  Using floor slider 20x Bil flexion/ext    Sit to Sand  --   6 x UE suppor: VC for > LE than UE              PT Short Term Goals - 10/12/19 2030      PT SHORT TERM GOAL #1   Title  The patient will demonstrate basic self care strategies and initial HEP to promote healing    Time  4    Period  Weeks    Status  New    Target Date  11/09/19      PT SHORT TERM GOAL #2   Title  The patient will report a 30% improvement in right knee pain with walking and standing for grocery shopping and short to medium community distances    Time  4    Period  Weeks    Status  New      PT SHORT TERM GOAL #3   Title  The patient will have improved right knee ROM 20-96 degrees for greater ease in/out of the car, rising and with up/down curbs    Time  4    Period  Weeks    Status  New      PT SHORT TERM GOAL #4   Title  FOTO functional outcome score  improved from 53% to 43% limitation    Time  4    Period  Weeks    Status  New        PT Long Term Goals - 10/12/19 2033      PT LONG TERM GOAL #1   Title  The patient will be independent with safe self progression of HEP and return to gym program    Time  8    Period  Weeks    Status  New    Target Date  12/07/19      PT LONG TERM GOAL #2   Title  The patient will report a 60% improvement in right knee pain with home and work duties    Time  8    Period  Weeks    Status  New      PT LONG TERM GOAL #3   Title  The patient will have improved right knee ROM 10-105 degrees for greater ease with rising, stooping for work and negotiating steps/curbs    Time  8    Period  Weeks    Status  New      PT LONG TERM GOAL #4   Title  Right knee strength improved to 4/5 to 4+/5  needed for stooping and lifting at work    Time  8    Period  Weeks    Status  New      PT LONG TERM GOAL #5   Title  FOTO functional outcome score improved from 53% limitation to 31% limitation indicating improved function with less pain    Time  8    Period  Weeks    Status  New            Plan - 11/08/19 0947    Clinical Impression Statement  Pt arrives 15 min late for treatment today. Pt reports his back pain is the cause for his slowness in getting out of bed and ultimately to his appt. Pt was able to double the work regarding his LE strengthening exs. Definite fatigue but no complaints of pain. Pt did  require 1 seated rest break when doing our standing exercises secondary to fatigue.    Personal Factors and Comorbidities  Past/Current Experience;Comorbidity 1;Comorbidity 2;Comorbidity 3+;Profession    Comorbidities  morbid obesity; Blount's disease with previous knee surgeries; diabetes;  strenuous work    Freight forwarder;Other;Shop    Stability/Clinical Decision  Making  Evolving/Moderate complexity    Rehab Potential  Good    PT Frequency  2x / week    PT Duration  8 weeks    PT Treatment/Interventions  ADLs/Self Care Home Management;Aquatic Therapy;Cryotherapy;Electrical Stimulation;Moist Heat;Therapeutic exercise;Therapeutic activities;Stair training;Gait training;Neuromuscular re-education;Manual techniques;Patient/family education;Taping;Vasopneumatic Device    PT Next Visit Plan  aquatic PT;  combo seated and standing ex's; sit to stand from mat table;  Nu-Step    PT Home Exercise Plan  Access Code: [email or web address]    Consulted and Agree with Plan of Care  Patient       Patient will benefit from skilled therapeutic intervention in order to improve the following deficits and impairments:  Difficulty walking, Decreased range of motion, Obesity, Decreased activity tolerance, Impaired perceived functional ability, Pain, Decreased mobility, Decreased strength  Visit Diagnosis: Acute pain of right knee  Stiffness of right knee, not elsewhere classified  Muscle weakness (generalized)     Problem List Patient Active Problem List   Diagnosis Date Noted  . S/P laparoscopic sleeve gastrectomy Nov 2016 07/10/2015  . Morbid obesity with body mass index of 70 and over in adult [facility]) 07/08/2015  . Morbid obesity ([place]) 02/10/2013  . Cellulitis of leg, left 02/10/2013  . Blount's disease 02/09/2013    Amber Williard, PTA 11/08/2019, 10:09 AM  [facility] [address], [place], [place], [address] Phone: [phone]   Fax:  [phone]  Name: Anthony Cox MRN: [id number] Date of Birth: [birth year]

## 2019-11-14 ENCOUNTER — Ambulatory Visit: Payer: Medicare Other | Attending: Family Medicine | Admitting: Physical Therapy

## 2019-11-14 ENCOUNTER — Encounter: Payer: Self-pay | Admitting: Physical Therapy

## 2019-11-14 ENCOUNTER — Other Ambulatory Visit: Payer: Self-pay

## 2019-11-14 DIAGNOSIS — M6281 Muscle weakness (generalized): Secondary | ICD-10-CM | POA: Diagnosis present

## 2019-11-14 DIAGNOSIS — M25561 Pain in right knee: Secondary | ICD-10-CM | POA: Diagnosis present

## 2019-11-14 DIAGNOSIS — M25661 Stiffness of right knee, not elsewhere classified: Secondary | ICD-10-CM | POA: Diagnosis present

## 2019-11-14 NOTE — Therapy (Signed)
[facility] [address], [place], [place], [address] Phone: [phone]   Fax:  [phone]  Physical Therapy Treatment  Patient Details  Name: Anthony Cox MRN: [id number] Date of Birth: Nov 04, 1990 Referring Provider (PT): Dr. Rip Harbour    Encounter Date: 11/14/2019  PT End of Session - 11/14/19 0908    Visit Number  8    Date for PT Re-Evaluation  12/07/19    Authorization Type  [employer]HC Medicare;  10th visit progress note    PT Start Time  0900   15 min late   PT Stop Time  0930    PT Time Calculation (min)  30 min    Activity Tolerance  Patient tolerated treatment well;Patient limited by fatigue       Past Medical History:  Diagnosis Date  . Anemia   . Blount's disease   . Diabetes mellitus without complication ([place])   . GERD (gastroesophageal reflux disease)   . Headache    comes and goes; pt states relates to OSA  . History of cellulitis    left leg   . History of strep sore throat   . Lower leg edema    left; comes and goes   . Morbid obesity ([place])   . OSA (obstructive sleep apnea)    noncompliant with cpap  . Shortness of breath dyspnea    on exertion     Past Surgical History:  Procedure Laterality Date  . BREATH TEK H PYLORI N/A 02/14/2015   Procedure: BREATH TEK H PYLORI;  Surgeon: Johnathan Hausen, MD;  Location: [facility];  Service: General;  Laterality: N/A;  . KNEE SURGERY     bilat;   . LAPAROSCOPIC GASTRIC SLEEVE RESECTION N/A 07/08/2015   Procedure: LAPAROSCOPIC GASTRIC SLEEVE RESECTION;  Surgeon: Johnathan Hausen, MD;  [facility];  Service: General;  Laterality: N/A;  . UPPER GI ENDOSCOPY N/A 07/08/2015   Procedure: UPPER GI ENDOSCOPY;  Surgeon: Johnathan Hausen, MD;  [facility];  Service: General;  Laterality: N/A;    There were no vitals filed for this visit.  Subjective Assessment - 11/14/19 0903    Subjective  Arrives 15 min late.  Rough weekend riding to [facility].  It was cold and windy.  My knees are stiff from the car ride.  I'm usually wiped out after PT b/c we did more.    Pertinent History  Blount's disease resulting in 2 major surgeries on legs when 74 and 29 years old; right has staples and left has staples and a bar and mild OA in knees;  gastric sleeve surgery 2016;  previously worked out at [employer]    Currently in Pain?  No/denies    Pain Score  0-No pain    Pain Location  Knee    Pain Orientation  Right;Left    Pain Type  Acute pain         [facility] PT Assessment - 11/14/19 0001      AROM   Right Knee Extension  20    Right Knee Flexion  96    Left Knee Extension  15    Left Knee Flexion  110                   [facility] Adult PT Treatment/Exercise - 11/14/19 0001      Knee/Hip Exercises: Aerobic   Nustep  LE only: seat 12, L1 slow x 6 min      Knee/Hip Exercises:  Standing   Hip Abduction  AROM;Stengthening;Both;2 sets;5 reps;Knee straight    Hip Extension  AROM;Stengthening;Both;1 set;5 reps;Knee straight    Other Standing Knee Exercises  HS curls 5x each side       Knee/Hip Exercises: Seated   Long Arc Quad  Strengthening;Right;Left;10 reps    Long Arc Quad Weight  3 lbs.    Other Seated Knee/Hip Exercises  Using floor slider 20x Bil flexion/ext    Marching  AROM;Right;Left;5 reps    Sit to Sand  2 sets;5 reps   mat table plus black foam               PT Short Term Goals - 11/14/19 1759      PT SHORT TERM GOAL #1   Title  The patient will demonstrate basic self care strategies and initial HEP to promote healing    Status  Achieved      PT SHORT TERM GOAL #2   Title  The patient will report a 30% improvement in right knee pain with walking and standing for grocery shopping and short to medium community distances    Time  4    Period  Weeks    Status  On-going      PT SHORT TERM GOAL #3   Title  The patient will have improved right knee ROM 20-96 degrees for greater ease in/out of the car, rising  and with up/down curbs    Status  Achieved      PT SHORT TERM GOAL #4   Title  FOTO functional outcome score improved from 53% to 43% limitation    Time  4    Period  Weeks    Status  On-going        PT Long Term Goals - 10/12/19 2033      PT LONG TERM GOAL #1   Title  The patient will be independent with safe self progression of HEP and return to gym program    Time  8    Period  Weeks    Status  New    Target Date  12/07/19      PT LONG TERM GOAL #2   Title  The patient will report a 60% improvement in right knee pain with home and work duties    Time  8    Period  Weeks    Status  New      PT LONG TERM GOAL #3   Title  The patient will have improved right knee ROM 10-105 degrees for greater ease with rising, stooping for work and negotiating steps/curbs    Time  8    Period  Weeks    Status  New      PT LONG TERM GOAL #4   Title  Right knee strength improved to 4/5 to 4+/5  needed for stooping and lifting at work    Time  8    Period  Weeks    Status  New      PT LONG TERM GOAL #5   Title  FOTO functional outcome score improved from 53% limitation to 31% limitation indicating improved function with less pain    Time  8    Period  Weeks    Status  New            Plan - 11/14/19 1610    Clinical Impression Statement  The patient reports stiffness > pain today.  He reports general fatigue and minor discomfort post treatment  session.  States he doesn't feel like he did as well today compared to last visit.   Some crepitus with knee extensions with weight but not painful.  Slower progression necessary secondary to co-morbidities including chronicity and obesity.  Therapist monitoring response with all interventions.  Partial STGS met.    Comorbidities  morbid obesity; Blount's disease with previous knee surgeries; diabetes;  strenuous work    Rehab Potential  Good    PT Frequency  2x / week    PT Duration  8 weeks    PT Treatment/Interventions  ADLs/Self Care  Home Management;Aquatic Therapy;Cryotherapy;Electrical Stimulation;Moist Heat;Therapeutic exercise;Therapeutic activities;Stair training;Gait training;Neuromuscular re-education;Manual techniques;Patient/family education;Taping;Vasopneumatic Device    PT Next Visit Plan  check FOTO and % improvement for remaining STGS;  aquatic PT;  combo seated and standing ex's; sit to stand from mat table;  Nu-Step    PT Home Exercise Plan  Access Code: [email or web address]       Patient will benefit from skilled therapeutic intervention in order to improve the following deficits and impairments:  Difficulty walking, Decreased range of motion, Obesity, Decreased activity tolerance, Impaired perceived functional ability, Pain, Decreased mobility, Decreased strength  Visit Diagnosis: Acute pain of right knee  Stiffness of right knee, not elsewhere classified  Muscle weakness (generalized)     Problem List Patient Active Problem List   Diagnosis Date Noted  . S/P laparoscopic sleeve gastrectomy Nov 2016 07/10/2015  . Morbid obesity with body mass index of 70 and over in adult [facility]) 07/08/2015  . Morbid obesity ([place]) 02/10/2013  . Cellulitis of leg, left 02/10/2013  . Blount's disease 02/09/2013   Ruben Im, PT 11/14/19 6:02 PM Phone: [phone] Fax: [phone] Alvera Singh 11/14/2019, 6:01 PM  [facility] [address], [place], [place], [address] Phone: [phone]   Fax:  [phone]  Name: Joan Avetisyan MRN: [id number] Date of Birth: April 19, 1991

## 2019-11-17 ENCOUNTER — Ambulatory Visit: Payer: Medicare Other | Admitting: Physical Therapy

## 2019-11-17 ENCOUNTER — Other Ambulatory Visit: Payer: Self-pay

## 2019-11-17 ENCOUNTER — Encounter: Payer: Self-pay | Admitting: Physical Therapy

## 2019-11-17 DIAGNOSIS — M25561 Pain in right knee: Secondary | ICD-10-CM | POA: Diagnosis not present

## 2019-11-17 DIAGNOSIS — M6281 Muscle weakness (generalized): Secondary | ICD-10-CM

## 2019-11-17 DIAGNOSIS — M25661 Stiffness of right knee, not elsewhere classified: Secondary | ICD-10-CM

## 2019-11-17 NOTE — Therapy (Signed)
[facility] [address], [place], [place], [address] Phone: [phone]   Fax:  [phone]  Physical Therapy Treatment  Patient Details  Name: Anthony Cox MRN: [id number] Date of Birth: 1991/07/25 Referring Provider (PT): Dr. Rip Harbour    Encounter Date: 11/17/2019  PT End of Session - 11/17/19 1606    Visit Number  9    Date for PT Re-Evaluation  12/07/19    Authorization Type  [employer] Medicare;  10th visit progress note    PT Start Time  1230   pt late   PT Stop Time  1300    PT Time Calculation (min)  30 min    Activity Tolerance  Patient tolerated treatment well    Behavior During Therapy  [facility] for tasks assessed/performed       Past Medical History:  Diagnosis Date  . Anemia   . Blount's disease   . Diabetes mellitus without complication ([place])   . GERD (gastroesophageal reflux disease)   . Headache    comes and goes; pt states relates to OSA  . History of cellulitis    left leg   . History of strep sore throat   . Lower leg edema    left; comes and goes   . Morbid obesity ([place])   . OSA (obstructive sleep apnea)    noncompliant with cpap  . Shortness of breath dyspnea    on exertion     Past Surgical History:  Procedure Laterality Date  . BREATH TEK H PYLORI N/A 02/14/2015   Procedure: BREATH TEK H PYLORI;  Surgeon: Johnathan Hausen, MD;  Location: [facility];  Service: General;  Laterality: N/A;  . KNEE SURGERY     bilat;   . LAPAROSCOPIC GASTRIC SLEEVE RESECTION N/A 07/08/2015   Procedure: LAPAROSCOPIC GASTRIC SLEEVE RESECTION;  Surgeon: Johnathan Hausen, MD;  Location: [facility];  Service: General;  Laterality: N/A;  . UPPER GI ENDOSCOPY N/A 07/08/2015   Procedure: UPPER GI ENDOSCOPY;  Surgeon: Johnathan Hausen, MD;  Location: [facility];  Service: General;  Laterality: N/A;    There were no vitals filed for this visit.  Subjective Assessment - 11/17/19 1649    Subjective  15 min late. Pt ambulates  into the pool area much faster gait speed, minimal limping,    Pertinent History  Blount's disease resulting in 2 major surgeries on legs when 56 and 29 years old; right has staples and left has staples and a bar and mild OA in knees;  gastric sleeve surgery 2016;  previously worked out at [employer]    Currently in Pain?  No/denies       Treatment: Aquatic session Water temp 86.7 degrees F Pt entered the water via long ramp using Bil UE  Seated 75% submersion: LE AROM exercises for ankle, knee and hip. Concurrent discussion of current status and goals for treatment today.  Mid-waist depth: Water walking 2 lengths of pool with increased speed, no stopping for rest. 4 directions.  Lat press on blue noodle with high knee marching 1 length of pool.  Decompression rest to unweight LEx 1 min Underwater push off wall with breaststroke 1/2 length of pool. Pt demonstrates antalgic gait to return to wall.  Alternating hip abd, flex, ext 10x bil                          PT Short Term Goals - 11/14/19 1759  PT SHORT TERM GOAL #1   Title  The patient will demonstrate basic self care strategies and initial HEP to promote healing    Status  Achieved      PT SHORT TERM GOAL #2   Title  The patient will report a 30% improvement in right knee pain with walking and standing for grocery shopping and short to medium community distances    Time  4    Period  Weeks    Status  On-going      PT SHORT TERM GOAL #3   Title  The patient will have improved right knee ROM 20-96 degrees for greater ease in/out of the car, rising and with up/down curbs    Status  Achieved      PT SHORT TERM GOAL #4   Title  FOTO functional outcome score improved from 53% to 43% limitation    Time  4    Period  Weeks    Status  On-going        PT Long Term Goals - 10/12/19 2033      PT LONG TERM GOAL #1   Title  The patient will be independent with safe self progression of HEP and return to gym  program    Time  8    Period  Weeks    Status  New    Target Date  12/07/19      PT LONG TERM GOAL #2   Title  The patient will report a 60% improvement in right knee pain with home and work duties    Time  8    Period  Weeks    Status  New      PT LONG TERM GOAL #3   Title  The patient will have improved right knee ROM 10-105 degrees for greater ease with rising, stooping for work and negotiating steps/curbs    Time  8    Period  Weeks    Status  New      PT LONG TERM GOAL #4   Title  Right knee strength improved to 4/5 to 4+/5  needed for stooping and lifting at work    Time  8    Period  Weeks    Status  New      PT LONG TERM GOAL #5   Title  FOTO functional outcome score improved from 53% limitation to 31% limitation indicating improved function with less pain    Time  8    Period  Weeks    Status  New            Plan - 11/17/19 1650    Clinical Impression Statement  Pt arrived 15 min late for pool session. Pt's overall mobility was significantly improved starting from when he walked into the pool area and including his movement in the water. Pt was able to try underwater swimming, an activity he really enjoys. This involved pushing off the wall which he could do with good strength, he did have increased pain in the right knee. It should be noted this activity gave the pt great confidence in his abilities (and joy).    Personal Factors and Comorbidities  Past/Current Experience;Comorbidity 1;Comorbidity 2;Comorbidity 3+;Profession    Comorbidities  morbid obesity; Blount's disease with previous knee surgeries; diabetes;  strenuous work    Freight forwarder;Other;Shop    Stability/Clinical Decision Making  Evolving/Moderate complexity    Rehab Potential  Good    PT Duration  8 weeks    PT Treatment/Interventions  ADLs/Self Care Home  Management;Aquatic Therapy;Cryotherapy;Electrical Stimulation;Moist Heat;Therapeutic exercise;Therapeutic activities;Stair training;Gait training;Neuromuscular re-education;Manual techniques;Patient/family education;Taping;Vasopneumatic Device    PT Next Visit Plan  check FOTO and % improvement for remaining STGS; combo seated and standing ex's; sit to stand from mat table;  Nu-Step    PT Home Exercise Plan  Access Code: [email or web address]    Consulted and Agree with Plan of Care  Patient       Patient will benefit from skilled therapeutic intervention in order to improve the following deficits and impairments:  Difficulty walking, Decreased range of motion, Obesity, Decreased activity tolerance, Impaired perceived functional ability, Pain, Decreased mobility, Decreased strength  Visit Diagnosis: Acute pain of right knee  Stiffness of right knee, not elsewhere classified  Muscle weakness (generalized)     Problem List Patient Active Problem List   Diagnosis Date Noted  . S/P laparoscopic sleeve gastrectomy Nov 2016 07/10/2015  . Morbid obesity with body mass index of 70 and over in adult [facility]) 07/08/2015  . Morbid obesity ([place]) 02/10/2013  . Cellulitis of leg, left 02/10/2013  . Blount's disease 02/09/2013    Hadyn Azer, PTA 11/17/2019, 7:24 PM  [facility] [address] [place], [place], [address] Phone: [phone]   Fax:  [phone]  Name: Anthony Cox MRN: [id number] Date of Birth: 08-Oct-1990

## 2019-11-21 ENCOUNTER — Ambulatory Visit: Payer: Medicare Other | Admitting: Physical Therapy

## 2019-11-21 ENCOUNTER — Other Ambulatory Visit: Payer: Self-pay

## 2019-11-21 DIAGNOSIS — M6281 Muscle weakness (generalized): Secondary | ICD-10-CM

## 2019-11-21 DIAGNOSIS — M25661 Stiffness of right knee, not elsewhere classified: Secondary | ICD-10-CM

## 2019-11-21 DIAGNOSIS — M25561 Pain in right knee: Secondary | ICD-10-CM

## 2019-11-21 NOTE — Therapy (Signed)
[facility] [address] [place], [place], [address] Phone: [phone]   Fax:  [phone]  Physical Therapy Treatment  Patient Details  Name: Anthony Cox MRN: [id number] Date of Birth: February 10, 1991 Referring Provider (PT): Dr. Rip Harbour   Progress Note Reporting Period 10/12/2019 to 11/21/2019  See note below for Objective Data and Assessment of Progress/Goals.      Encounter Date: 11/21/2019  PT End of Session - 11/21/19 1130    Visit Number  10    Date for PT Re-Evaluation  12/07/19    Authorization Type  [employer]HC Medicare;  10th visit progress note    PT Start Time  1058    PT Stop Time  1140    PT Time Calculation (min)  42 min    Activity Tolerance  Patient tolerated treatment well       Past Medical History:  Diagnosis Date  . Anemia   . Blount's disease   . Diabetes mellitus without complication ([place])   . GERD (gastroesophageal reflux disease)   . Headache    comes and goes; pt states relates to OSA  . History of cellulitis    left leg   . History of strep sore throat   . Lower leg edema    left; comes and goes   . Morbid obesity ([place])   . OSA (obstructive sleep apnea)    noncompliant with cpap  . Shortness of breath dyspnea    on exertion     Past Surgical History:  Procedure Laterality Date  . BREATH TEK H PYLORI N/A 02/14/2015   Procedure: BREATH TEK H PYLORI;  Surgeon: Johnathan Hausen, MD;  Location: [facility];  Service: General;  Laterality: N/A;  . KNEE SURGERY     bilat;   . LAPAROSCOPIC GASTRIC SLEEVE RESECTION N/A 07/08/2015   Procedure: LAPAROSCOPIC GASTRIC SLEEVE RESECTION;  Surgeon: Johnathan Hausen, MD;  [facility];  Service: General;  Laterality: N/A;  . UPPER GI ENDOSCOPY N/A 07/08/2015   Procedure: UPPER GI ENDOSCOPY;  Surgeon: Johnathan Hausen, MD;  [facility];  Service: General;  Laterality: N/A;    There were no vitals filed for this visit.  Subjective  Assessment - 11/21/19 1103    Subjective  I'm fine around the house but not work.   I haven't tried walking yet.  I haven't been to the gym yet.  Just stiff.    Pertinent History  Blount's disease resulting in 2 major surgeries on legs when 67 and 29 years old; right has staples and left has staples and a bar and mild OA in knees;  gastric sleeve surgery 2016;  previously worked out at [employer]    Currently in Pain?  No/denies    Pain Score  0-No pain         [facility] PT Assessment - 11/21/19 0001      Observation/Other Assessments   Focus on Therapeutic Outcomes (FOTO)   56%       AROM   Right Knee Extension  8    Right Knee Flexion  114    Left Knee Extension  0    Left Knee Flexion  130      Strength   Right Knee Flexion  4-/5    Right Knee Extension  4-/5    Left Knee Flexion  4-/5    Left Knee Extension  4-/5  [facility] PT Treatment/Exercise - 11/21/19 0001      Knee/Hip Exercises: Aerobic   Nustep  LE only: seat 12, L1 24 spm average x 10 min      Knee/Hip Exercises: Standing   Forward Lunges  Right;Left;5 reps    Forward Lunges Limitations  holding 8#     Side Lunges  Right;Left;5 reps    Side Lunges Limitations  holding 8#     Other Standing Knee Exercises  partial dead lifts from between knees 2x 10     Other Standing Knee Exercises  dead lift from floor 10# 10x       Knee/Hip Exercises: Seated   Other Seated Knee/Hip Exercises  Using floor slider 20x Bil flexion/ext    Sit to Sand  2 sets;5 reps   holding 5# weight               PT Short Term Goals - 11/21/19 1113      PT SHORT TERM GOAL #1   Title  The patient will demonstrate basic self care strategies and initial HEP to promote healing    Status  Achieved      PT SHORT TERM GOAL #2   Title  The patient will report a 30% improvement in right knee pain with walking and standing for grocery shopping and short to medium community distances    Baseline  58-59%    Status   Achieved      PT SHORT TERM GOAL #3   Title  The patient will have improved right knee ROM 20-96 degrees for greater ease in/out of the car, rising and with up/down curbs    Status  Achieved      PT SHORT TERM GOAL #4   Title  FOTO functional outcome score improved from 53% to 43% limitation    Time  4    Period  Weeks    Status  On-going        PT Long Term Goals - 11/21/19 1114      PT LONG TERM GOAL #1   Title  The patient will be independent with safe self progression of HEP and return to gym program    Time  8    Period  Weeks    Status  On-going      PT LONG TERM GOAL #2   Title  The patient will report a 60% improvement in right knee pain with home and work duties    Time  8    Period  Weeks    Status  On-going      PT LONG TERM GOAL #3   Title  The patient will have improved right knee ROM 10-105 degrees for greater ease with rising, stooping for work and negotiating steps/curbs    Status  Achieved      PT LONG TERM GOAL #4   Title  Right knee strength improved to 4/5 to 4+/5  needed for stooping and lifting at work    Status  On-going      PT LONG TERM GOAL #5   Title  FOTO functional outcome score improved from 53% limitation to 31% limitation indicating improved function with less pain    Time  8    Period  Weeks    Status  On-going            Plan - 11/21/19 1131    Clinical Impression Statement  Although his FOTO outcome score did not improve, the patient rates  his overall improvement at "58%".  HIs knee ROM has significantly improved from initial assessment for both flexion and extension.  We have discussed a return to walking program fo 5 minutes 3x/day for 1 week then increase to 7-8 minutes 2x/day.  He is able to increase intensity of exercise today including added weight/resistance.  He reports some increase in left knee pain but dissipates fairly quickly.    Comorbidities  morbid obesity; Blount's disease with previous knee surgeries; diabetes;   strenuous work    Rehab Potential  Good    PT Frequency  2x / week    PT Duration  8 weeks    PT Treatment/Interventions  ADLs/Self Care Home Management;Aquatic Therapy;Cryotherapy;Electrical Stimulation;Moist Heat;Therapeutic exercise;Therapeutic activities;Stair training;Gait training;Neuromuscular re-education;Manual techniques;Patient/family education;Taping;Vasopneumatic Device    PT Next Visit Plan  Do 6 min walk test; dead lifting increase weight; increase speed on Nu-Step (try intervals)    PT Home Exercise Plan  Access Code: [email or web address]       Patient will benefit from skilled therapeutic intervention in order to improve the following deficits and impairments:  Difficulty walking, Decreased range of motion, Obesity, Decreased activity tolerance, Impaired perceived functional ability, Pain, Decreased mobility, Decreased strength  Visit Diagnosis: Acute pain of right knee  Stiffness of right knee, not elsewhere classified  Muscle weakness (generalized)     Problem List Patient Active Problem List   Diagnosis Date Noted  . S/P laparoscopic sleeve gastrectomy Nov 2016 07/10/2015  . Morbid obesity with body mass index of 70 and over in adult [facility]) 07/08/2015  . Morbid obesity ([place]) 02/10/2013  . Cellulitis of leg, left 02/10/2013  . Blount's disease 02/09/2013   Ruben Im, PT 11/21/19 11:39 AM Phone: [phone] Fax: [phone] Alvera Singh 11/21/2019, 11:38 AM  [facility] [address] [place], [place], [address] Phone: [phone]   Fax:  [phone]  Name: Anthony Cox MRN: [id number] Date of Birth: September 12, 1990

## 2019-11-24 ENCOUNTER — Ambulatory Visit: Payer: Medicare Other | Admitting: Physical Therapy

## 2019-11-27 ENCOUNTER — Encounter: Payer: Medicare Other | Admitting: Physical Therapy

## 2019-12-01 ENCOUNTER — Ambulatory Visit: Payer: Medicare Other | Admitting: Physical Therapy

## 2019-12-01 ENCOUNTER — Encounter: Payer: Self-pay | Admitting: Physical Therapy

## 2019-12-01 ENCOUNTER — Other Ambulatory Visit: Payer: Self-pay

## 2019-12-01 DIAGNOSIS — M25661 Stiffness of right knee, not elsewhere classified: Secondary | ICD-10-CM

## 2019-12-01 DIAGNOSIS — M25561 Pain in right knee: Secondary | ICD-10-CM | POA: Diagnosis not present

## 2019-12-01 DIAGNOSIS — M6281 Muscle weakness (generalized): Secondary | ICD-10-CM

## 2019-12-01 NOTE — Therapy (Addendum)
[facility] [address] [place], [place], [address] Phone: [phone]   Fax:  [phone]  Physical Therapy Treatment/Discharge Summary   Patient Details  Name: Anthony Cox MRN: [id number] Date of Birth: 26-Feb-1991 Referring Provider (PT): Dr. Rip Harbour    Encounter Date: 12/01/2019  PT End of Session - 12/01/19 1557    Visit Number  11    Date for PT Re-Evaluation  12/07/19    Authorization Type  [employer]HC Medicare;  10th visit progress note    PT Start Time  1220    PT Stop Time  1300    PT Time Calculation (min)  40 min    Activity Tolerance  Patient tolerated treatment well    Behavior During Therapy  [facility] for tasks assessed/performed       Past Medical History:  Diagnosis Date  . Anemia   . Blount's disease   . Diabetes mellitus without complication ([place])   . GERD (gastroesophageal reflux disease)   . Headache    comes and goes; pt states relates to OSA  . History of cellulitis    left leg   . History of strep sore throat   . Lower leg edema    left; comes and goes   . Morbid obesity ([place])   . OSA (obstructive sleep apnea)    noncompliant with cpap  . Shortness of breath dyspnea    on exertion     Past Surgical History:  Procedure Laterality Date  . BREATH TEK H PYLORI N/A 02/14/2015   Procedure: BREATH TEK H PYLORI;  Surgeon: Johnathan Hausen, MD;  Location: [facility];  Service: General;  Laterality: N/A;  . KNEE SURGERY     bilat;   . LAPAROSCOPIC GASTRIC SLEEVE RESECTION N/A 07/08/2015   Procedure: LAPAROSCOPIC GASTRIC SLEEVE RESECTION;  Surgeon: Johnathan Hausen, MD;  [facility];  Service: General;  Laterality: N/A;  . UPPER GI ENDOSCOPY N/A 07/08/2015   Procedure: UPPER GI ENDOSCOPY;  Surgeon: Johnathan Hausen, MD;  [facility];  Service: General;  Laterality: N/A;    There were no vitals filed for this visit.  Subjective Assessment - 12/01/19 1612    Subjective  My knees feel  great. My doctor wants me to go to work conditioning at [employer]. This will be my last appt with you guys.    Pertinent History  Blount's disease resulting in 2 major surgeries on legs when 53 and 29 years old; right has staples and left has staples and a bar and mild OA in knees;  gastric sleeve surgery 2016;  previously worked out at [employer]    Currently in Pain?  No/denies         [facility] PT Assessment - 12/01/19 0001      Assessment   Medical Diagnosis  right knee contusion     Referring Provider (PT)  Dr. Rip Harbour     Onset Date/Surgical Date  10/02/19      Observation/Other Assessments   Focus on Therapeutic Outcomes (FOTO)   56%       AROM   Right Knee Extension  8    Right Knee Flexion  114    Left Knee Extension  0    Left Knee Flexion  130      Strength   Right Knee Flexion  4-/5    Right Knee Extension  4-/5    Left Knee Flexion  4-/5    Left Knee Extension  4-/5  Treatment: Aquatic session Water temp 87.6 degrees F Pt enters and exits pool via long ramp using bil hand rails.  Water walking high waist depth: VC to create more current to increase resistance. Each direction 2 lengths of the pool.  Water walking forward pushing the blue noodle under the water to simulate pushing a load. Length of pool 3x.  Standing hip 3 ways Bil 20x each   Decompression hang x 3-5 min to unload knees using blue noodle.                       PT Short Term Goals - 11/21/19 1113      PT SHORT TERM GOAL #1   Title  The patient will demonstrate basic self care strategies and initial HEP to promote healing    Status  Achieved      PT SHORT TERM GOAL #2   Title  The patient will report a 30% improvement in right knee pain with walking and standing for grocery shopping and short to medium community distances    Baseline  58-59%    Status  Achieved      PT SHORT TERM GOAL #3   Title  The patient will have improved right knee ROM 20-96 degrees for greater ease  in/out of the car, rising and with up/down curbs    Status  Achieved      PT SHORT TERM GOAL #4   Title  FOTO functional outcome score improved from 53% to 43% limitation    Time  4    Period  Weeks    Status  On-going        PT Long Term Goals - 12/01/19 1621      PT LONG TERM GOAL #1   Title  The patient will be independent with safe self progression of HEP and return to gym program    Time  8    Period  Weeks    Status  Achieved      PT LONG TERM GOAL #2   Title  The patient will report a 60% improvement in right knee pain with home and work duties    Time  8    Period  Weeks    Status  Achieved   60%-70%     PT LONG TERM GOAL #3   Title  The patient will have improved right knee ROM 10-105 degrees for greater ease with rising, stooping for work and negotiating steps/curbs    Time  8    Period  Weeks    Status  Achieved      PT LONG TERM GOAL #4   Title  Right knee strength improved to 4/5 to 4+/5  needed for stooping and lifting at work    Time  8    Period  Weeks    Status  Partially Met      PT LONG TERM GOAL #5   Title  FOTO functional outcome score improved from 53% limitation to 31% limitation indicating improved function with less pain    Time  8    Period  Weeks    Status  Not Met            Plan - 12/01/19 1614    Clinical Impression Statement  Pt arrives reporting his doctor has informed him he wants him to continue with PT at [facility] to do work conditioning. He is walking 15 minutes a day that includes hills and stairs. He  was able to maximize the resistance in the water today challenging his ability to push and pull. No pain in the pool today, some LE fatigue. Pt has met most of his long term goal except for his FOTO goal and strength, although general knee strength and functional use is greatly improved. Pt rates his current level of improvement with PT at 60%-70%.    Personal Factors and Comorbidities  Past/Current Experience;Comorbidity  1;Comorbidity 2;Comorbidity 3+;Profession    Comorbidities  morbid obesity; Blount's disease with previous knee surgeries; diabetes;  strenuous work    Freight forwarder;Other;Shop    Stability/Clinical Decision Making  Evolving/Moderate complexity    Rehab Potential  Good    PT Frequency  2x / week    PT Duration  8 weeks    PT Treatment/Interventions  ADLs/Self Care Home Management;Aquatic Therapy;Cryotherapy;Electrical Stimulation;Moist Heat;Therapeutic exercise;Therapeutic activities;Stair training;Gait training;Neuromuscular re-education;Manual techniques;Patient/family education;Taping;Vasopneumatic Device    PT Next Visit Plan  Discharge to work conditioning program.    PT Home Exercise Plan  Access Code: [email or web address]    [email or web address] and Agree with Plan of Care  Patient       Patient will benefit from skilled therapeutic intervention in order to improve the following deficits and impairments:  Difficulty walking, Decreased range of motion, Obesity, Decreased activity tolerance, Impaired perceived functional ability, Pain, Decreased mobility, Decreased strength  Visit Diagnosis: Acute pain of right knee  Stiffness of right knee, not elsewhere classified  Muscle weakness (generalized)    PHYSICAL THERAPY DISCHARGE SUMMARY  Visits from Start of Care: 11  Current functional level related to goals / functional outcomes: The patient requests discharge from this facility stating he will be doing Work Conditioning as a part of Workers comp at another facility.   Remaining deficits: As above.    Education / Equipment: HEP Plan: Patient agrees to discharge.  Patient goals were partially met. Patient is being discharged due to the patient's request.  ?????      Problem List Patient Active Problem List   Diagnosis Date Noted  . S/P laparoscopic sleeve  gastrectomy Nov 2016 07/10/2015  . Morbid obesity with body mass index of 70 and over in adult [facility]) 07/08/2015  . Morbid obesity ([place]) 02/10/2013  . Cellulitis of leg, left 02/10/2013  . Blount's disease 02/09/2013   Ruben Im, PT 01/16/20 7:05 AM Phone: [phone] Fax: [phone]  Myrene Galas, PTA 12/01/19 4:28 PM  [facility] [address] [place], [place], [address] Phone: [phone]   Fax:  [phone]  Name: Anthony Cox MRN: [id number] Date of Birth: 03-06-1991

## 2019-12-05 ENCOUNTER — Ambulatory Visit: Payer: Medicare Other | Admitting: Physical Therapy

## 2020-03-24 ENCOUNTER — Other Ambulatory Visit: Payer: Self-pay

## 2020-03-24 ENCOUNTER — Ambulatory Visit (INDEPENDENT_AMBULATORY_CARE_PROVIDER_SITE_OTHER)
Admission: EM | Admit: 2020-03-24 | Discharge: 2020-03-25 | Disposition: A | Discharge status: Other Acute Inpatient Hospital | Payer: Medicare Other | Source: Home / Self Care

## 2020-03-24 ENCOUNTER — Encounter (HOSPITAL_COMMUNITY): Payer: Self-pay

## 2020-03-24 DIAGNOSIS — Z9119 Patient's noncompliance with other medical treatment and regimen: Secondary | ICD-10-CM | POA: Insufficient documentation

## 2020-03-24 DIAGNOSIS — K219 Gastro-esophageal reflux disease without esophagitis: Secondary | ICD-10-CM | POA: Insufficient documentation

## 2020-03-24 DIAGNOSIS — E119 Type 2 diabetes mellitus without complications: Secondary | ICD-10-CM | POA: Insufficient documentation

## 2020-03-24 DIAGNOSIS — M92519 Juvenile osteochondrosis of proximal tibia, unspecified leg: Secondary | ICD-10-CM | POA: Insufficient documentation

## 2020-03-24 DIAGNOSIS — G4733 Obstructive sleep apnea (adult) (pediatric): Secondary | ICD-10-CM | POA: Insufficient documentation

## 2020-03-24 DIAGNOSIS — F332 Major depressive disorder, recurrent severe without psychotic features: Secondary | ICD-10-CM | POA: Diagnosis present

## 2020-03-24 DIAGNOSIS — Z79899 Other long term (current) drug therapy: Secondary | ICD-10-CM | POA: Insufficient documentation

## 2020-03-24 DIAGNOSIS — Z20822 Contact with and (suspected) exposure to covid-19: Secondary | ICD-10-CM | POA: Insufficient documentation

## 2020-03-24 LAB — POC SARS CORONAVIRUS 2 AG -  ED: SARS Coronavirus 2 Ag: NEGATIVE

## 2020-03-24 LAB — POC SARS CORONAVIRUS 2 AG: SARS Coronavirus 2 Ag: NEGATIVE

## 2020-03-24 MED ORDER — ALUM & MAG HYDROXIDE-SIMETH 200-200-20 MG/5ML PO SUSP: 30.0000 mL | ORAL | Status: DC | PRN

## 2020-03-24 MED ORDER — HYDROXYZINE HCL 25 MG PO TABS: 50.0000 mg | ORAL_TABLET | Freq: Four times a day (QID) | ORAL | Status: DC | PRN

## 2020-03-24 MED ORDER — TRAZODONE HCL 100 MG PO TABS: 100.0000 mg | ORAL_TABLET | Freq: Every evening | ORAL | Status: DC | PRN

## 2020-03-24 MED ORDER — ACETAMINOPHEN 325 MG PO TABS: 650.0000 mg | ORAL_TABLET | Freq: Four times a day (QID) | ORAL | Status: DC | PRN

## 2020-03-24 MED ORDER — MAGNESIUM HYDROXIDE 400 MG/5ML PO SUSP: 30.0000 mL | Freq: Every day | ORAL | Status: DC | PRN

## 2020-03-24 NOTE — ED Notes (Signed)
[address] 

## 2020-03-24 NOTE — ED Provider Notes (Signed)
Behavioral Health Admission H&P [facility] & OBS)  Date: 03/24/20 Patient Name: Anthony Cox MRN: [id number] Chief Complaint: Suicidal  Chief Complaint  Patient presents with  . Suicidal      Diagnoses: MDD, recurrent Final diagnoses:  None   HPI: Anthony Cox 29 y.o male was seen today as a walk in at [facility] voluntarily. The patient voiced that he has been having passive suicidal taught's for many years. He expressed his SI has been present since childhood, but recently, due to relationships problems with families and friends, the patient voiced that he is wanting to end his life. He expressed he plans to take care of everything he needs to do before ending his life. He said that he does not have access to a weapon, but he can get one. The patient voiced living in the house with his mom and his 37 year old sister.  The patient disclosed that he has a PCP, Dr. Kenton Kingfisher, but he is not on any antidepressant or psychiatric medication; he voiced that he does not have a psychiatric provider. He has never been hospitalized for any mental health care. He stated his past SI was 1-2 months ago by drinking an entire bottle of liquor and has done that a couple of times a week. He disclosed that he works but was in an [place] in February and recently returned to work a few weeks ago. He revealed his job is stressful.  During his evaluation, the patient is alert and oriented x 4, calm, cooperative, and mood-congruent with affect. The patient does not appear to be responding to internal or external stimuli. Neither is the patient presenting with any delusional thinking. The patient admits to auditory hallucinations but denies visual hallucinations. The patient admits to suicidal ideations with a plan" to make it happen by the end of the year.", He denies homicidal or self-harm ideations. The patient is not presenting with any psychotic or paranoid behaviors. During an encounter with the patient, he was able to answer  questions appropriately.  PHQ 2-9:     Total Time spent with patient: 30 minutes  Musculoskeletal  Strength & Muscle Tone: within normal limits Gait & Station: normal Patient leans: N/A  Psychiatric Specialty Exam  Presentation General Appearance: Appropriate for Environment;Well Groomed  Eye Contact:Good  Speech:Clear and Coherent  Speech Volume:Normal  Handedness:Right   Mood and Affect  Mood:Depressed;Irritable  Affect:Blunt;Congruent;Depressed;Flat   Thought Process  Thought Processes:Coherent  Descriptions of Associations:Intact  Orientation:Full (Time, Place and Person)  Thought Content:Logical  Hallucinations:Hallucinations: Auditory Description of Auditory Hallucinations: "Voices in the back of my head telling me to just do it."  Ideas of Reference:None  Suicidal Thoughts:Suicidal Thoughts: Yes, Active SI Active Intent and/or Plan: With Intent;With Plan;With Means to Carry Out  Homicidal Thoughts:Homicidal Thoughts: No   Sensorium  Memory:Immediate Good;Recent Good;Remote Good  Judgment:Poor  Insight:Poor   Executive Functions  Concentration:Fair  Attention Span:Good  [place] of Knowledge:Good  Language:Good   Psychomotor Activity  Psychomotor Activity:Psychomotor Activity: Normal   Assets  Assets:Desire for Improvement;Financial Resources/Insurance;Intimacy;Social Support   Sleep  Sleep:Sleep: Fair   Physical Exam ROS  Blood pressure 138/86, pulse 87, temperature 98.3 F (36.8 C), resp. rate 18, SpO2 97 %. There is no height or weight on file to calculate BMI.  Past Psychiatric History: None  Is the patient at risk to self? Yes  Has the patient been a risk to self in the past 6 months? Yes .    Has the patient been a risk  to self within the distant past? Yes   Is the patient a risk to others? No   Has the patient been a risk to others in the past 6 months? No   Has the patient been a risk to others  within the distant past? No   Past Medical History:  Past Medical History:  Diagnosis Date  . Anemia   . Blount's disease   . Diabetes mellitus without complication ([place])   . GERD (gastroesophageal reflux disease)   . Headache    comes and goes; pt states relates to OSA  . History of cellulitis    left leg   . History of strep sore throat   . Lower leg edema    left; comes and goes   . Morbid obesity ([place])   . OSA (obstructive sleep apnea)    noncompliant with cpap  . Shortness of breath dyspnea    on exertion     Past Surgical History:  Procedure Laterality Date  . BREATH TEK H PYLORI N/A 02/14/2015   Procedure: BREATH TEK H PYLORI;  Surgeon: Johnathan Hausen, MD;  Location: [facility];  Service: General;  Laterality: N/A;  . KNEE SURGERY     bilat;   . LAPAROSCOPIC GASTRIC SLEEVE RESECTION N/A 07/08/2015   Procedure: LAPAROSCOPIC GASTRIC SLEEVE RESECTION;  Surgeon: Johnathan Hausen, MD;  Location: [facility];  Service: General;  Laterality: N/A;  . UPPER GI ENDOSCOPY N/A 07/08/2015   Procedure: UPPER GI ENDOSCOPY;  Surgeon: Johnathan Hausen, MD;  Location: [facility];  Service: General;  Laterality: N/A;    Family History:  Family History  Problem Relation Age of Onset  . Obesity Other   . Sarcoidosis Mother   . Arthritis Mother   . Diabetes Maternal Grandfather   . Prostate cancer Maternal Grandfather   . Arthritis Maternal Grandmother     Social History:  Social History   Socioeconomic History  . Marital status: Single    Spouse name: Not on file  . Number of children: Not on file  . Years of education: Not on file  . Highest education level: Not on file  Occupational History  . Not on file  Tobacco Use  . Smoking status: Never Smoker  . Smokeless tobacco: Never Used  Substance and Sexual Activity  . Alcohol use: Yes    Comment: hx of alcohol use stopped last week   . Drug use: Yes    Types: Marijuana    Comment: during high school years   . Sexual activity: Not  on file  Other Topics Concern  . Not on file  Social History Narrative   DTT[employer].  Works part time.     Social Determinants of Health   Financial Resource Strain:   . Difficulty of Paying Living Expenses:   Food Insecurity:   . Worried About Charity fundraiser in the Last Year:   . Arboriculturist in the Last Year:   Transportation Needs:   . Film/video editor (Medical):   Marland Kitchen Lack of Transportation (Non-Medical):   Physical Activity:   . Days of Exercise per Week:   . Minutes of Exercise per Session:   Stress:   . Feeling of Stress :   Social Connections:   . Frequency of Communication with Friends and Family:   . Frequency of Social Gatherings with Friends and Family:   . Attends Religious Services:   . Active Member of Clubs or Organizations:   . Attends Club  or Organization Meetings:   Marland Kitchen Marital Status:   Intimate Partner Violence:   . Fear of Current or Ex-Partner:   . Emotionally Abused:   Marland Kitchen Physically Abused:   . Sexually Abused:     SDOH:  SDOH Screenings   Alcohol Screen:   . Last Alcohol Screening Score (AUDIT):   Depression (PHQ2-9):   . PHQ-2 Score:   Financial Resource Strain:   . Difficulty of Paying Living Expenses:   Food Insecurity:   . Worried About Charity fundraiser in the Last Year:   . [place] in the Last Year:   Housing:   . Last Housing Risk Score:   Physical Activity:   . Days of Exercise per Week:   . Minutes of Exercise per Session:   Social Connections:   . Frequency of Communication with Friends and Family:   . Frequency of Social Gatherings with Friends and Family:   . Attends Religious Services:   . Active Member of Clubs or Organizations:   . Attends Archivist Meetings:   Marland Kitchen Marital Status:   Stress:   . Feeling of Stress :   Tobacco Use: Low Risk   . Smoking Tobacco Use: Never Smoker  . Smokeless Tobacco Use: Never Used  Transportation Needs:   . Film/video editor (Medical):   Marland Kitchen Lack of  Transportation (Non-Medical):     Last Labs:  Admission on 03/24/2020  Component Date Value Ref Range Status  . SARS Coronavirus 2 Ag 03/24/2020 Negative  Negative Final    Allergies: Patient has no known allergies.  PTA Medications: (Not in a hospital admission)   Medical Decision Making  The patient is a safety risk to himself and currently is requiring psychiatric inpatient admission for stabilization and treatment.    Recommendations  Based on my evaluation the patient does not appear to have an emergency medical condition.  Caroline Sauger, NP 03/24/20  10:54 PM

## 2020-03-24 NOTE — ED Triage Notes (Signed)
Pt presents with suicidal thoughts.

## 2020-03-24 NOTE — BH Assessment (Signed)
Comprehensive Clinical Assessment (CCA) Note  03/25/2020 Anthony Cox [id number]  Patient presenting to [facility] as a walk-in with due to [facility] with plan to shoot self with a gun. Patient reported SI since childhood. Patient reported current SI with plan worsened since 03/05/2020, which is when he began daily video diaries leading up to his suicide, which he is planning at the end of the year. Patient reported increased anxiety after last nights big argument with male friend, which led to patient showing his mother his video diaries and asking for help. Patient stated "I am going to take care of everything by the end of the year, then I am going to use a gun". Patient denied having a gun in the home, however patient reported having access to get one. Patient denied inpatient hospitalizations, self-harming behaviors. Patient reported SI was 1-2 months ago by drinking an entire bottle of liquor and has done that a couple of times a week.   Patient reported not having access to a weapon, but he can get one. The patient voiced living in the house with his mom and his 71 year old sister. Patient reported seeing PCP, Dr. Kenton Kingfisher. Patient denied psych medications. He disclosed that he works but was in an [place] in February and recently returned to work a few weeks ago. Patient reported stressful on the job.   Disposition Ysidro Evert, NP, patient meets inpatient criteria. SW to secure placement.   Visit Diagnosis:      ICD-10-CM   1. Severe episode of recurrent major depressive disorder, without psychotic features ([place])  F33.2       CCA Screening, Triage and Referral (STR)  Patient Reported Information How did you hear about [place]? Self  Referral name: No data recorded Referral phone number: No data recorded  Whom do you see for routine medical problems? Primary Care  Practice/Facility Name: Dr. Shirline Frees  Practice/Facility Phone Number: No data recorded Name of Contact: No data  recorded Contact Number: No data recorded Contact Fax Number: No data recorded Prescriber Name: No data recorded Prescriber Address (if known): No data recorded  What Is the Reason for Your Visit/Call Today? No data recorded How Long Has This Been Causing You Problems? 1 wk - 1 month  What Do You Feel Would Help You the Most Today? Other (Comment) ("I don't know")   Have You Recently Been in Any Inpatient Treatment (Hospital/Detox/Crisis Center/28-Day Program)? No  Name/Location of Program/Hospital:No data recorded How Long Were You There? No data recorded When Were You Discharged? No data recorded  Have You Ever Received Services From [facility] Before? No  Who Do You See at [facility]? No data recorded  Have You Recently Had Any Thoughts About Hurting Yourself? Yes  Are You Planning to Commit Suicide/Harm Yourself At This time? Yes   Have you Recently Had Thoughts About Hurting Someone Guadalupe Dawn? Yes  Explanation: No data recorded  Have You Used Any Alcohol or Drugs in the Past 24 Hours? No  How Long Ago Did You Use Drugs or Alcohol? No data recorded What Did You Use and How Much? No data recorded  Do You Currently Have a Therapist/Psychiatrist? No  Name of Therapist/Psychiatrist: No data recorded  Have You Been Recently Discharged From Any Office Practice or Programs? No  Explanation of Discharge From Practice/Program: No data recorded    CCA Screening Triage Referral Assessment Type of Contact: Face-to-Face  Is this Initial or Reassessment? No data recorded Date Telepsych consult ordered in [facility]:  No data  recorded Time Telepsych consult ordered in [facility]:  No data recorded  Patient Reported Information Reviewed? Yes  Patient Left Without Being Seen? No data recorded Reason for Not Completing Assessment: No data recorded  Collateral Involvement: No data recorded  Does Patient Have a [place]? No data recorded Name and Contact of Legal  Guardian: No data recorded If Minor and Not Living with Parent(s), Who has Custody? No data recorded Is CPS involved or ever been involved? Never  Is APS involved or ever been involved? Never   Patient Determined To Be At Risk for Harm To Self or Others Based on Review of Patient Reported Information or Presenting Complaint? Yes, for Self-Harm  Method: No data recorded Availability of Means: No data recorded Intent: No data recorded Notification Required: No data recorded Additional Information for Danger to Others Potential: No data recorded Additional Comments for Danger to Others Potential: No data recorded Are There Guns or Other Weapons in Your Home? No data recorded Types of Guns/Weapons: No data recorded Are These Weapons Safely Secured?                            No data recorded Who Could Verify You Are Able To Have These Secured: No data recorded Do You Have any Outstanding Charges, Pending Court Dates, Parole/Probation? No data recorded Contacted To Inform of Risk of Harm To Self or Others: No data recorded  Location of Assessment: [facility] Assessment Services   Does Patient Present under Involuntary Commitment? No  IVC Papers Initial File Date: No data recorded  [place] of Residence: [place]   Patient Currently Receiving the Following Services: Not Receiving Services   Determination of Need: Emergent (2 hours)   Options For Referral: Inpatient Hospitalization     CCA Biopsychosocial  Intake/Chief Complaint:  CCA Intake With Chief Complaint Chief Complaint/Presenting Problem: SI with plan to shoot himself Patient's Currently Reported Symptoms/Problems: SI with plan to shoot himself  Mental Health Symptoms Depression:  Depression: Hopelessness, Worthlessness, Change in energy/activity, Fatigue  Mania:  Mania: None  Anxiety:   Anxiety: Fatigue, Difficulty concentrating, Restlessness  Psychosis:  Psychosis: Hallucinations  Trauma:  Trauma: None   Obsessions:  Obsessions: None  Compulsions:     Inattention:  Inattention: None  Hyperactivity/Impulsivity:  Hyperactivity/Impulsivity: N/A  Oppositional/Defiant Behaviors:  Oppositional/Defiant Behaviors: None  Emotional Irregularity:     Other Mood/Personality Symptoms:      Mental Status Exam Appearance and self-care  Stature:  Stature: Average  Weight:  Weight: Obese  Clothing:  Clothing: Age-appropriate  Grooming:  Grooming: Normal  Cosmetic use:  Cosmetic Use: Age appropriate  Posture/gait:  Posture/Gait: Normal  Motor activity:  Motor Activity: Not Remarkable  Sensorium  Attention:  Attention: Normal  Concentration:  Concentration: Normal  Orientation:  Orientation: Person  Recall/memory:  Recall/Memory: Normal  Affect and Mood  Affect:  Affect: Depressed, Flat  Mood:  Mood: Depressed, Worthless, Hopeless  Relating  Eye contact:  Eye Contact: Avoided  Facial expression:  Facial Expression: Sad  Attitude toward examiner:  Attitude Toward Examiner: Cooperative  Thought and Language  Speech flow: Speech Flow: Normal  Thought content:     Preoccupation:  Preoccupations: Suicide  Hallucinations:  Hallucinations: Command (Comment), Auditory  Organization:     Transport planner of Knowledge:     Intelligence:     Abstraction:     Judgement:  Judgement: Poor  Reality Testing:     Insight:  Decision Making:     Social Functioning  Social Maturity:     Social Judgement:     Stress  Stressors:  Stressors: Relationship, Other (Comment), Family conflict  Coping Ability:     Skill Deficits:     Supports:  Supports: Family     Religion:    Leisure/Recreation:    Exercise/Diet: Exercise/Diet Do You Exercise?: No   CCA Employment/Education  Employment/Work Situation: Employment / Work Situation Employment situation: Employed Where is patient currently employed?: yes What is the longest time patient has a held a job?: uta Where was the patient  employed at that time?: uta Has patient ever been in the [employer]?: No  Education: Education Is Patient Currently Attending School?: No Did Teacher, adult education From [employer]?: Yes Did You Have Any Difficulty At [employer]?: No   CCA Family/Childhood History  Family and Relationship History: Family history Marital status: Single  Childhood History:  Childhood History Additional childhood history information: uta Description of patient's relationship with caregiver when they were a child: uta Patient's description of current relationship with people who raised him/her: uta How were you disciplined when you got in trouble as a child/adolescent?: uta Did patient suffer any verbal/emotional/physical/sexual abuse as a child?: No Did patient suffer from severe childhood neglect?: No Has patient ever been sexually abused/assaulted/raped as an adolescent or adult?: No Was the patient ever a victim of a crime or a disaster?: No  Child/Adolescent Assessment:     CCA Substance Use  Alcohol/Drug Use: Alcohol / Drug Use Pain Medications: see MAR Prescriptions: see MAR Over the Counter: see MAR History of alcohol / drug use?: No history of alcohol / drug abuse                         ASAM's:  Six Dimensions of Multidimensional Assessment  Dimension 1:  Acute Intoxication and/or Withdrawal Potential:      Dimension 2:  Biomedical Conditions and Complications:      Dimension 3:  Emotional, Behavioral, or Cognitive Conditions and Complications:     Dimension 4:  Readiness to Change:     Dimension 5:  Relapse, Continued use, or Continued Problem Potential:     Dimension 6:  Recovery/Living Environment:     ASAM Severity Score:    ASAM Recommended Level of Treatment:     Substance use Disorder (SUD)    Recommendations for Services/Supports/Treatments:    DSM5 Diagnoses: Patient Active Problem List   Diagnosis Date Noted  . MDD (major depressive disorder), recurrent  episode, severe ([place]) 03/24/2020  . S/P laparoscopic sleeve gastrectomy Nov 2016 07/10/2015  . Morbid obesity with body mass index of 70 and over in adult [facility]) 07/08/2015  . Morbid obesity ([place]) 02/10/2013  . Cellulitis of leg, left 02/10/2013  . Blount's disease 02/09/2013    Patient Centered Plan: Patient is on the following Treatment Plan(s):   Referrals to Alternative Service(s): Referred to Alternative Service(s):   Place:   Date:   Time:    Referred to Alternative Service(s):   Place:   Date:   Time:    Referred to Alternative Service(s):   Place:   Date:   Time:    Referred to Alternative Service(s):   Place:   Date:   Time:     Herbert Spires AlstonComprehensive Clinical Assessment (CCA) Screening, Triage and Referral Note  03/25/2020 Anthony Cox [id number]  Visit Diagnosis:    ICD-10-CM   1. Severe episode of recurrent major  depressive disorder, without psychotic features [facility])  F33.2     Patient Reported Information How did you hear about [place]? Self   Referral name: No data recorded  Referral phone number: No data recorded Whom do you see for routine medical problems? Primary Care   Practice/Facility Name: Dr. Shirline Frees   Practice/Facility Phone Number: No data recorded  Name of Contact: No data recorded  Contact Number: No data recorded  Contact Fax Number: No data recorded  Prescriber Name: No data recorded  Prescriber Address (if known): No data recorded What Is the Reason for Your Visit/Call Today? No data recorded How Long Has This Been Causing You Problems? 1 wk - 1 month  Have You Recently Been in Any Inpatient Treatment (Hospital/Detox/Crisis Center/28-Day Program)? No   Name/Location of Program/Hospital:No data recorded  How Long Were You There? No data recorded  When Were You Discharged? No data recorded Have You Ever Received Services From [facility] Before? No   Who Do You See at [facility]? No data recorded Have You Recently Had Any  Thoughts About Hurting Yourself? Yes   Are You Planning to Commit Suicide/Harm Yourself At This time?  Yes  Have you Recently Had Thoughts About Hurting Someone Guadalupe Dawn? Yes   Explanation: No data recorded Have You Used Any Alcohol or Drugs in the Past 24 Hours? No   How Long Ago Did You Use Drugs or Alcohol?  No data recorded  What Did You Use and How Much? No data recorded What Do You Feel Would Help You the Most Today? Other (Comment) ("I don't know")  Do You Currently Have a Therapist/Psychiatrist? No   Name of Therapist/Psychiatrist: No data recorded  Have You Been Recently Discharged From Any Office Practice or Programs? No   Explanation of Discharge From Practice/Program:  No data recorded    CCA Screening Triage Referral Assessment Type of Contact: Face-to-Face   Is this Initial or Reassessment? No data recorded  Date Telepsych consult ordered in [facility]:  No data recorded  Time Telepsych consult ordered in [facility]:  No data recorded Patient Reported Information Reviewed? Yes   Patient Left Without Being Seen? No data recorded  Reason for Not Completing Assessment: No data recorded Collateral Involvement: No data recorded Does Patient Have a [place]? No data recorded  Name and Contact of Legal Guardian:  No data recorded If Minor and Not Living with Parent(s), Who has Custody? No data recorded Is CPS involved or ever been involved? Never  Is APS involved or ever been involved? Never  Patient Determined To Be At Risk for Harm To Self or Others Based on Review of Patient Reported Information or Presenting Complaint? Yes, for Self-Harm   Method: No data recorded  Availability of Means: No data recorded  Intent: No data recorded  Notification Required: No data recorded  Additional Information for Danger to Others Potential:  No data recorded  Additional Comments for Danger to Others Potential:  No data recorded  Are There Guns or Other Weapons in Your  Home?  No data recorded   Types of Guns/Weapons: No data recorded   Are These Weapons Safely Secured?                              No data recorded   Who Could Verify You Are Able To Have These Secured:    No data recorded Do You Have any Outstanding Charges, Pending  Court Dates, Parole/Probation? No data recorded Contacted To Inform of Risk of Harm To Self or Others: No data recorded Location of Assessment: [facility] Assessment Services  Does Patient Present under Involuntary Commitment? No   IVC Papers Initial File Date: No data recorded  [place] of Residence: [place]  Patient Currently Receiving the Following Services: Not Receiving Services   Determination of Need: Emergent (2 hours)   Options For Referral: Inpatient Hospitalization   Venora Maples, [facility]

## 2020-03-25 ENCOUNTER — Other Ambulatory Visit: Payer: Self-pay

## 2020-03-25 ENCOUNTER — Encounter (HOSPITAL_COMMUNITY): Payer: Self-pay | Admitting: Psychiatry

## 2020-03-25 ENCOUNTER — Inpatient Hospital Stay (HOSPITAL_COMMUNITY)
Admission: AD | Admit: 2020-03-25 | Discharge: 2020-03-27 | DRG: 885 | Disposition: A | Discharge status: Home / Self Care | Payer: Medicare Other | Source: Intra-hospital | Attending: Psychiatry | Admitting: Psychiatry

## 2020-03-25 DIAGNOSIS — F332 Major depressive disorder, recurrent severe without psychotic features: Secondary | ICD-10-CM | POA: Diagnosis present

## 2020-03-25 DIAGNOSIS — M92519 Juvenile osteochondrosis of proximal tibia, unspecified leg: Secondary | ICD-10-CM | POA: Diagnosis present

## 2020-03-25 DIAGNOSIS — F333 Major depressive disorder, recurrent, severe with psychotic symptoms: Secondary | ICD-10-CM

## 2020-03-25 DIAGNOSIS — R45851 Suicidal ideations: Secondary | ICD-10-CM | POA: Diagnosis not present

## 2020-03-25 DIAGNOSIS — E119 Type 2 diabetes mellitus without complications: Secondary | ICD-10-CM | POA: Diagnosis present

## 2020-03-25 DIAGNOSIS — F419 Anxiety disorder, unspecified: Secondary | ICD-10-CM | POA: Diagnosis present

## 2020-03-25 DIAGNOSIS — K219 Gastro-esophageal reflux disease without esophagitis: Secondary | ICD-10-CM | POA: Diagnosis present

## 2020-03-25 DIAGNOSIS — G4700 Insomnia, unspecified: Secondary | ICD-10-CM | POA: Diagnosis present

## 2020-03-25 DIAGNOSIS — G4733 Obstructive sleep apnea (adult) (pediatric): Secondary | ICD-10-CM | POA: Diagnosis present

## 2020-03-25 DIAGNOSIS — Z20822 Contact with and (suspected) exposure to covid-19: Secondary | ICD-10-CM | POA: Diagnosis present

## 2020-03-25 DIAGNOSIS — Z79899 Other long term (current) drug therapy: Secondary | ICD-10-CM

## 2020-03-25 HISTORY — DX: Depression, unspecified: F32.A

## 2020-03-25 HISTORY — DX: Anxiety disorder, unspecified: F41.9

## 2020-03-25 LAB — CBC WITH DIFFERENTIAL/PLATELET
Abs Immature Granulocytes: 0.02 10*3/uL (ref 0.00–0.07)
Basophils Absolute: 0 10*3/uL (ref 0.0–0.1)
Basophils Relative: 0 %
Eosinophils Absolute: 0.1 10*3/uL (ref 0.0–0.5)
Eosinophils Relative: 1 %
HCT: 48.5 % (ref 39.0–52.0)
Hemoglobin: 15.4 g/dL (ref 13.0–17.0)
Immature Granulocytes: 0 %
Lymphocytes Relative: 39 %
Lymphs Abs: 3.1 10*3/uL (ref 0.7–4.0)
MCH: 26.6 pg (ref 26.0–34.0)
MCHC: 31.8 g/dL (ref 30.0–36.0)
MCV: 83.9 fL (ref 80.0–100.0)
Monocytes Absolute: 0.4 10*3/uL (ref 0.1–1.0)
Monocytes Relative: 6 %
Neutro Abs: 4.3 10*3/uL (ref 1.7–7.7)
Neutrophils Relative %: 54 %
Platelets: 277 10*3/uL (ref 150–400)
RBC: 5.78 MIL/uL (ref 4.22–5.81)
RDW: 15.1 % (ref 11.5–15.5)
WBC: 8 10*3/uL (ref 4.0–10.5)
nRBC: 0 % (ref 0.0–0.2)

## 2020-03-25 LAB — BILIRUBIN, DIRECT: Bilirubin, Direct: 0.1 mg/dL (ref 0.0–0.2)

## 2020-03-25 LAB — COMPREHENSIVE METABOLIC PANEL
ALT: 27 U/L (ref 0–44)
AST: 20 U/L (ref 15–41)
Albumin: 3.8 g/dL (ref 3.5–5.0)
Alkaline Phosphatase: 48 U/L (ref 38–126)
Anion gap: 11 (ref 5–15)
BUN: 12 mg/dL (ref 6–20)
CO2: 26 mmol/L (ref 22–32)
Calcium: 9.1 mg/dL (ref 8.9–10.3)
Chloride: 101 mmol/L (ref 98–111)
Creatinine, Ser: 0.73 mg/dL (ref 0.61–1.24)
GFR calc Af Amer: >60 mL/min (ref >60)
GFR calc non Af Amer: >60 mL/min (ref >60)
Glucose, Bld: 124 mg/dL — ABNORMAL HIGH (ref 70–99)
Potassium: 3.6 mmol/L (ref 3.5–5.1)
Sodium: 138 mmol/L (ref 135–145)
Total Bilirubin: 1 mg/dL (ref 0.3–1.2)
Total Protein: 7.5 g/dL (ref 6.5–8.1)

## 2020-03-25 LAB — LIPID PANEL
Cholesterol: 225 mg/dL — ABNORMAL HIGH (ref 0–200)
HDL: 39 mg/dL — ABNORMAL LOW (ref >40)
LDL Cholesterol: 159 mg/dL — ABNORMAL HIGH (ref 0–99)
Total CHOL/HDL Ratio: 5.8 RATIO
Triglycerides: 133 mg/dL (ref <150)
VLDL: 27 mg/dL (ref 0–40)

## 2020-03-25 LAB — POCT URINE DRUG SCREEN - MANUAL ENTRY (I-SCREEN)
POC Amphetamine UR: NOT DETECTED
POC Buprenorphine (BUP): NOT DETECTED
POC Cocaine UR: NOT DETECTED
POC Marijuana UR: NOT DETECTED
POC Methadone UR: NOT DETECTED
POC Methamphetamine UR: NOT DETECTED
POC Morphine: POSITIVE — AB
POC Oxazepam (BZO): NOT DETECTED
POC Oxycodone UR: NOT DETECTED
POC Secobarbital (BAR): NOT DETECTED

## 2020-03-25 LAB — ETHANOL: Alcohol, Ethyl (B): <10 mg/dL (ref <10)

## 2020-03-25 LAB — HEMOGLOBIN A1C
Hgb A1c MFr Bld: 6.4 % — ABNORMAL HIGH (ref 4.8–5.6)
Mean Plasma Glucose: 136.98 mg/dL

## 2020-03-25 LAB — SARS CORONAVIRUS 2 BY RT PCR (HOSPITAL ORDER, PERFORMED IN ~~LOC~~ HOSPITAL LAB): SARS Coronavirus 2: NEGATIVE

## 2020-03-25 LAB — TSH: TSH: 1.99 u[IU]/mL (ref 0.350–4.500)

## 2020-03-25 MED ORDER — METHOCARBAMOL 500 MG PO TABS: 500.0000 mg | ORAL_TABLET | Freq: Three times a day (TID) | ORAL | Status: DC | PRN

## 2020-03-25 MED ORDER — NAPROXEN 500 MG PO TABS: 500.0000 mg | ORAL_TABLET | Freq: Two times a day (BID) | ORAL | Status: DC | PRN

## 2020-03-25 MED ORDER — FLUOXETINE HCL 20 MG PO CAPS
20.0000 mg | ORAL_CAPSULE | Freq: Every day | ORAL | Status: DC
  Administered 2020-03-26 – 2020-03-27 (×2): 20 mg via ORAL
  Filled 2020-03-25 (×3): qty 1

## 2020-03-25 MED ORDER — ONDANSETRON 4 MG PO TBDP: 4.0000 mg | ORAL_TABLET | Freq: Four times a day (QID) | ORAL | Status: DC | PRN

## 2020-03-25 MED ORDER — TRAZODONE HCL 50 MG PO TABS
50.0000 mg | ORAL_TABLET | Freq: Every evening | ORAL | Status: DC | PRN
  Administered 2020-03-25: 50 mg via ORAL
  Filled 2020-03-25 (×3): qty 1

## 2020-03-25 MED ORDER — ALUM & MAG HYDROXIDE-SIMETH 200-200-20 MG/5ML PO SUSP: 30.0000 mL | ORAL | Status: DC | PRN

## 2020-03-25 MED ORDER — LORAZEPAM 1 MG PO TABS: 1.0000 mg | ORAL_TABLET | Freq: Four times a day (QID) | ORAL | Status: DC | PRN

## 2020-03-25 MED ORDER — ACETAMINOPHEN 325 MG PO TABS: 650.0000 mg | ORAL_TABLET | Freq: Four times a day (QID) | ORAL | Status: DC | PRN

## 2020-03-25 MED ORDER — DICYCLOMINE HCL 20 MG PO TABS: 20.0000 mg | ORAL_TABLET | Freq: Four times a day (QID) | ORAL | Status: DC | PRN

## 2020-03-25 MED ORDER — RISPERIDONE 1 MG PO TABS
1.0000 mg | ORAL_TABLET | Freq: Every day | ORAL | Status: DC
  Administered 2020-03-25: 1 mg via ORAL
  Filled 2020-03-25 (×3): qty 1

## 2020-03-25 MED ORDER — FOLIC ACID 1 MG PO TABS
1.0000 mg | ORAL_TABLET | Freq: Every day | ORAL | Status: DC
  Administered 2020-03-25 – 2020-03-27 (×3): 1 mg via ORAL
  Filled 2020-03-25 (×7): qty 1

## 2020-03-25 MED ORDER — LOPERAMIDE HCL 2 MG PO CAPS: 2.0000 mg | ORAL_CAPSULE | ORAL | Status: DC | PRN

## 2020-03-25 MED ORDER — THIAMINE HCL 100 MG PO TABS
100.0000 mg | ORAL_TABLET | Freq: Every day | ORAL | Status: DC
  Administered 2020-03-25 – 2020-03-27 (×3): 100 mg via ORAL
  Filled 2020-03-25 (×6): qty 1

## 2020-03-25 MED ORDER — CLONIDINE HCL 0.1 MG PO TABS
0.1000 mg | ORAL_TABLET | Freq: Once | ORAL | Status: AC
  Administered 2020-03-25: 0.1 mg via ORAL
  Filled 2020-03-25 (×2): qty 1

## 2020-03-25 MED ORDER — MAGNESIUM HYDROXIDE 400 MG/5ML PO SUSP: 30.0000 mL | Freq: Every day | ORAL | Status: DC | PRN

## 2020-03-25 MED ORDER — HYDROXYZINE HCL 25 MG PO TABS
25.0000 mg | ORAL_TABLET | Freq: Three times a day (TID) | ORAL | Status: DC | PRN
  Administered 2020-03-27: 25 mg via ORAL
  Filled 2020-03-25 (×3): qty 1

## 2020-03-25 NOTE — Progress Notes (Signed)
Pt reports feeling suicidal "for a while now." Pt shares that he was doing a video diary for 8 days and he had started it on July 27th. In his videos he would mention how he was feeling, his goals (one of which was paying his car off), how he felt alone even though he was surrounded by several people, and "out of the loop." Pt said he was "out of comfort zone" but had come to a point where he was "comfortable leaving." Pt is passively suicidal without a plan and contracts for safety. Endorses visual hallucination of shadows at times. Denies HI and AH. Active listening, reassurance, and support provided. Medications administered as ordered by MD. Q 15 min safety checks continue. Pt's safety has been maintained.    03/25/20 2040  Psych Admission Type (Psych Patients Only)  Admission Status Voluntary  Psychosocial Assessment  Patient Complaints Depression;Hopelessness;Insomnia;Sadness;Substance abuse;Worrying  Eye Contact Fair  Facial Expression Anxious;Sad;Worried  Affect Anxious;Depressed;Sad  Speech Logical/coherent  Interaction Assertive  Motor Activity Other (Comment) (WNL)  Appearance/Hygiene Disheveled  Behavior Characteristics Cooperative;Anxious;Calm  Mood Depressed;Anxious;Apprehensive;Sad;Worthless, low self-esteem  Thought Process  Coherency WDL  Content WDL  Delusions None reported or observed  Perception Hallucinations  Hallucination Visual  Judgment Poor  Confusion None  Danger to Self  Current suicidal ideation? Passive  Self-Injurious Behavior No self-injurious ideation or behavior indicators observed or expressed   Agreement Not to Harm Self Yes  Description of Agreement verbally contracts for safety  Danger to Others  Danger to Others None reported or observed

## 2020-03-25 NOTE — ED Notes (Signed)
Pt with NP

## 2020-03-25 NOTE — Plan of Care (Signed)
Nurse discussed anxiety, depression and coping skills with patient.  

## 2020-03-25 NOTE — ED Notes (Signed)
Pt sleeping at present, no distress noted, monitoring for safety. 

## 2020-03-25 NOTE — Progress Notes (Signed)
Inpatient Diabetes Program Recommendations  AACE/ADA: New Consensus Statement on Inpatient Glycemic Control (2015)  Target Ranges:  Prepandial:   less than 140 mg/dL      Peak postprandial:   less than 180 mg/dL (1-2 hours)      Critically ill patients:  140 - 180 mg/dL   Lab Results  Component Value Date   GLUCAP 227 (H) 07/08/2015   HGBA1C 6.4 (H) 03/24/2020    Review of Glycemic Control  Diabetes history: DM2 Outpatient Diabetes medications: None Current orders for Inpatient glycemic control: None  HgbA1C - 6.4% Glucose 124 mg/dL  Inpatient Diabetes Program Recommendations:     Encourage pt to make healthy choices with portion control in cafeteria.   Follow-up with PCP for diabetes care.   Thank you. Lorenda Peck, RD, LDN, CDE Inpatient Diabetes Coordinator [phone]

## 2020-03-25 NOTE — ED Notes (Signed)
Pt using the phone. 

## 2020-03-25 NOTE — ED Provider Notes (Signed)
[facility]BC/OBS ASAP Discharge Summary  Date and Time: 03/25/2020 1:57 PM  Name: Anthony Cox  MRN:  [id number]   Discharge Diagnoses:  Final diagnoses:  Severe episode of recurrent major depressive disorder, without psychotic features ([place])    Subjective: Patient reports a very convoluted story of friends and 2 girls that have been in a very tangled relationship. The relationship started 1 year ago and multiple people sleeping with others. This has become overwhelming to him and he was having persistent thoughts of killing himself. He reports that he had made suicide videos and showed them to his mother. He states that his mother is very supportive and he lives at home with his mother and sister. He reports that he also owns guns and he and a handgun and was contemplating shooting himself. He states that his brother now has the guns, but he still very depressed and that there is still thoughts of killing himself.   Stay Summary: Patient presented to the [facility] voluntarily and was having suicidal thoughts and was admitted to the continuous observation unit overnight. This morning he continues to have suicidal ideations and feels depressed. He will be admitted to the [facility] inpatient unit for medication management and stabilization. Patient does not have an outpatient therapist or provider for psychiatry.   Total Time spent with patient: 30 minutes  Past Psychiatric History: reports chronic depression and suicidal thoughts since a child. Past Medical History:  Past Medical History:  Diagnosis Date  . Anemia   . Blount's disease   . Diabetes mellitus without complication ([place])   . GERD (gastroesophageal reflux disease)   . Headache    comes and goes; pt states relates to OSA  . History of cellulitis    left leg   . History of strep sore throat   . Lower leg edema    left; comes and goes   . Morbid obesity ([place])   . OSA (obstructive sleep apnea)    noncompliant with cpap  . Shortness of breath  dyspnea    on exertion     Past Surgical History:  Procedure Laterality Date  . BREATH TEK H PYLORI N/A 02/14/2015   Procedure: BREATH TEK H PYLORI;  Surgeon: Johnathan Hausen, MD;  Location: [facility];  Service: General;  Laterality: N/A;  . KNEE SURGERY     bilat;   . LAPAROSCOPIC GASTRIC SLEEVE RESECTION N/A 07/08/2015   Procedure: LAPAROSCOPIC GASTRIC SLEEVE RESECTION;  Surgeon: Johnathan Hausen, MD;  Location: [facility];  Service: General;  Laterality: N/A;  . UPPER GI ENDOSCOPY N/A 07/08/2015   Procedure: UPPER GI ENDOSCOPY;  Surgeon: Johnathan Hausen, MD;  Location: [facility];  Service: General;  Laterality: N/A;   Family History:  Family History  Problem Relation Age of Onset  . Obesity Other   . Sarcoidosis Mother   . Arthritis Mother   . Diabetes Maternal Grandfather   . Prostate cancer Maternal Grandfather   . Arthritis Maternal Grandmother    Family Psychiatric History: None reported Social History:  Social History   Substance and Sexual Activity  Alcohol Use Yes   Comment: hx of alcohol use stopped last week      Social History   Substance and Sexual Activity  Drug Use Yes  . Types: Marijuana   Comment: during high school years     Social History   Socioeconomic History  . Marital status: Single    Spouse name: Not on file  . Number of children: Not on file  .  Years of education: Not on file  . Highest education level: Not on file  Occupational History  . Not on file  Tobacco Use  . Smoking status: Never Smoker  . Smokeless tobacco: Never Used  Substance and Sexual Activity  . Alcohol use: Yes    Comment: hx of alcohol use stopped last week   . Drug use: Yes    Types: Marijuana    Comment: during high school years   . Sexual activity: Not on file  Other Topics Concern  . Not on file  Social History Narrative   [employer].  Works part time.     Social Determinants of Health   Financial Resource Strain:   . Difficulty of Paying Living Expenses:   Food  Insecurity:   . Worried About Charity fundraiser in the Last Year:   . Arboriculturist in the Last Year:   Transportation Needs:   . Film/video editor (Medical):   Marland Kitchen Lack of Transportation (Non-Medical):   Physical Activity:   . Days of Exercise per Week:   . Minutes of Exercise per Session:   Stress:   . Feeling of Stress :   Social Connections:   . Frequency of Communication with Friends and Family:   . Frequency of Social Gatherings with Friends and Family:   . Attends Religious Services:   . Active Member of Clubs or Organizations:   . Attends Archivist Meetings:   Marland Kitchen Marital Status:    SDOH:  SDOH Screenings   Alcohol Screen:   . Last Alcohol Screening Score (AUDIT):   Depression (PHQ2-9):   . PHQ-2 Score:   Financial Resource Strain:   . Difficulty of Paying Living Expenses:   Food Insecurity:   . Worried About Charity fundraiser in the Last Year:   . [place] in the Last Year:   Housing:   . Last Housing Risk Score:   Physical Activity:   . Days of Exercise per Week:   . Minutes of Exercise per Session:   Social Connections:   . Frequency of Communication with Friends and Family:   . Frequency of Social Gatherings with Friends and Family:   . Attends Religious Services:   . Active Member of Clubs or Organizations:   . Attends Archivist Meetings:   Marland Kitchen Marital Status:   Stress:   . Feeling of Stress :   Tobacco Use: Low Risk   . Smoking Tobacco Use: Never Smoker  . Smokeless Tobacco Use: Never Used  Transportation Needs:   . Film/video editor (Medical):   Marland Kitchen Lack of Transportation (Non-Medical):     Has this patient used any form of tobacco in the last 30 days? (Cigarettes, Smokeless Tobacco, Cigars, and/or Pipes) A prescription for an FDA-approved tobacco cessation medication was offered at discharge and the patient refused  Current Medications:  Current Facility-Administered Medications  Medication Dose Route  Frequency Provider Last Rate Last Admin  . acetaminophen (TYLENOL) tablet 650 mg  650 mg Oral Q6H PRN Caroline Sauger, NP      . alum & mag hydroxide-simeth (MAALOX/MYLANTA) 200-200-20 MG/5ML suspension 30 mL  30 mL Oral Q4H PRN Caroline Sauger, NP      . hydrOXYzine (ATARAX/VISTARIL) tablet 50 mg  50 mg Oral Q6H PRN Caroline Sauger, NP      . magnesium hydroxide (MILK OF MAGNESIA) suspension 30 mL  30 mL Oral Daily PRN Caroline Sauger, NP      .  traZODone (DESYREL) tablet 100 mg  100 mg Oral QHS PRN Caroline Sauger, NP       No current outpatient medications on file.    PTA Medications: (Not in a hospital admission)   Musculoskeletal  Strength & Muscle Tone: within normal limits Gait & Station: normal Patient leans: N/A  Psychiatric Specialty Exam  Presentation  General Appearance: Appropriate for Environment;Casual  Eye Contact:Good  Speech:Clear and Coherent;Normal Rate  Speech Volume:Normal  Handedness:Right   Mood and Affect  Mood:Depressed;Anxious  Affect:Congruent   Thought Process  Thought Processes:Coherent  Descriptions of Associations:Intact  Orientation:Full (Time, Place and Person)  Thought Content:WDL  Hallucinations:Hallucinations: None Description of Auditory Hallucinations: "Voices in the back of my head telling me to just do it."  Ideas of Reference:None  Suicidal Thoughts:Suicidal Thoughts: Yes, Active SI Active Intent and/or Plan: With Intent;With Plan;With Means to Carry Out  Homicidal Thoughts:Homicidal Thoughts: No   Sensorium  Memory:Immediate Good;Recent Good;Remote Good  Judgment:Fair  Insight:Fair   Executive Functions  Concentration:Good  Attention Span:Good  Recall:Good  Fund of Knowledge:Good  Language:Good   Psychomotor Activity  Psychomotor Activity:Psychomotor Activity: Normal   Assets  Assets:Communication Skills;Desire for Improvement;Financial  Resources/Insurance;Housing;Social Support;Transportation   Sleep  Sleep:Sleep: Fair   Physical Exam  Physical Exam Vitals and nursing note reviewed.  Constitutional:      Appearance: He is well-developed.  Cardiovascular:     Rate and Rhythm: Normal rate.  Pulmonary:     Effort: Pulmonary effort is normal.  Musculoskeletal:        General: Normal range of motion.  Skin:    General: Skin is warm.  Neurological:     Mental Status: He is alert and oriented to person, place, and time.    Review of Systems  Constitutional: Negative.   HENT: Negative.   Eyes: Negative.   Respiratory: Negative.   Cardiovascular: Negative.   Gastrointestinal: Negative.   Genitourinary: Negative.   Musculoskeletal: Negative.   Skin: Negative.   Neurological: Negative.   Endo/Heme/Allergies: Negative.   Psychiatric/Behavioral: Positive for depression and suicidal ideas.   Blood pressure 132/69, pulse 68, temperature 97.9 F (36.6 C), temperature source Oral, resp. rate 20, SpO2 98 %. There is no height or weight on file to calculate BMI.   Disposition: Discharge to [facility] for admission   Lewis Shock, [place] 03/25/2020, 1:57 PM

## 2020-03-25 NOTE — ED Notes (Signed)
Safe transport called to transport pt to [facility]. Pt made aware and consent to transport. Safety maintained
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
## 2020-03-25 NOTE — ED Notes (Signed)
Pt resting in no acute distress. Safety maintained

## 2020-03-25 NOTE — ED Notes (Signed)
Pt walking around unit. Calm, cooperative with staff. Writer asked pt if he was still having suicidal thoughts and pt replied, "right now, I'm calm right now but my thoughts come and go. Anything set me over the edge. My family support is what got me feeling this way. I'm ok right now but 2 minutes from now, things could change. That's how my mood has been lately". Encouragement provided. Informed pt to notify staff if urges or impulses to harm self arise. Pt verbalized understanding. Will continue to monitor for safety.

## 2020-03-25 NOTE — ED Notes (Signed)
Pt A&O x 4, presents with passive SI, no plan noted.  Pt reports having thoughts for years.  Denies HI or AVH.  Pt reports he drank a bottle of liquor as SI attempt a few weeks ago.  Pt calm & cooperative, no distress noted.  Monitoring for safety.

## 2020-03-25 NOTE — ED Notes (Signed)
Pt calm and cooperative with staff. Presently looking at tv in no acute distress. Safety maintained

## 2020-03-25 NOTE — Tx Team (Signed)
Initial Treatment Plan 03/25/2020 7:37 PM [name] QHK:[id number]    PATIENT STRESSORS: Financial difficulties Marital or family conflict Medication change or noncompliance Occupational concerns Substance abuse   PATIENT STRENGTHS: Ability for insight Average or above average intelligence Capable of independent living Communication skills General fund of knowledge Motivation for treatment/growth Supportive family/friends Work skills   PATIENT IDENTIFIED PROBLEMS: "alcohol use"   Depression"  "anxiety"  "panic attacks"               DISCHARGE CRITERIA:  Ability to meet basic life and health needs Adequate post-discharge living arrangements Improved stabilization in mood, thinking, and/or behavior Medical problems require only outpatient monitoring Motivation to continue treatment in a less acute level of care Need for constant or close observation no longer present Reduction of life-threatening or endangering symptoms to within safe limits Safe-care adequate arrangements made Verbal commitment to aftercare and medication compliance Withdrawal symptoms are absent or subacute and managed without 24-hour nursing intervention  PRELIMINARY DISCHARGE PLAN: Attend aftercare/continuing care group Attend PHP/IOP Attend 12-step recovery group Outpatient therapy Participate in family therapy Return to previous living arrangement Return to previous work or school arrangements  PATIENT/FAMILY INVOLVEMENT: This treatment plan has been presented to and reviewed with the patient, Anthony Cox.  The patient and family have been given the opportunity to ask questions and make suggestions.  Cammy Copa, [place] 03/25/2020, 7:37 PM

## 2020-03-25 NOTE — ED Notes (Signed)
Meal giving; chips, sandwich, and juice.

## 2020-03-25 NOTE — Progress Notes (Signed)
   03/25/20 2040  COVID-19 Daily Checkoff  Have you had a fever (temp > 37.80C/100F)  in the past 24 hours?  No  COVID-19 EXPOSURE  Have you traveled outside the state in the past 14 days? No  Have you been in contact with someone with a confirmed diagnosis of COVID-19 or PUI in the past 14 days without wearing appropriate PPE? No  Have you been living in the same home as a person with confirmed diagnosis of COVID-19 or a PUI (household contact)? No  Have you been diagnosed with COVID-19? No

## 2020-03-25 NOTE — BH Assessment (Signed)
Per Kathalene Frames, RN Director, [facility], patient has been accepted to [facility], bed 301-1 ; Accepting provider is Dr. Dwyane Dee, MD; Attending provider is Dr. Myles Lipps, MD.    Patient can arrive at 3:00pm. Number for report is [phone].     Marvia Pickles, NP notified Marinda Elk, RN notified   Radonna Ricker, MSW, LCSW Clinical Social Worker [place]

## 2020-03-25 NOTE — ED Notes (Signed)
Pt alert and eating lunch. Denies concerns at present. Safety maintained

## 2020-03-25 NOTE — Progress Notes (Addendum)
Patient is 29 yr old male, first admission to [facility], voluntary.  SI off/on, contracts for safety.  Patient has stated "end it all", "____in my head, full blown conversations with myself, shadows only when not feeling OK.  If I killed myself I wouldn't have to go through this.  Death is permanent, need to be responsible.  I need to try."   Feels SI when depressed, contracts for safety.  Sometimes hyperventilates, shaking uncontrollable.  "Stopped when I stopped thinking about it.   Just end it.  Keep diary on phone.  I told mom and she cried.  Cannot handle all my issues by myself.  I need help to fight through this."      Rated anxiety, depression and hopeless #10.  Denied tobacco use.  Alcohol, social drinker, dark place drinks by himself until he passes out.  Has drank liquor since age of 29 yrs old, drinks at least every other day.  THC, does not have access to THC.  Denied cocaine, heroin, no other drugs.  Saturday night came to a breaking point, argued with a friend, scared me how ready I was to go.  Wanted to pay off my car.  Just end it all.  Worry about family, friends, that is how I got to this point.  Feel I have to help family financially.  Dad is bipolar, depressed, anxious."   Patient stated he drove himself to the assessment center and they drove him to [facility].   Patient stated in the past that he has been frustrated, banging head against wall, punching walls.  Had bilateral leg correction surgery as a young boy.  Abdominal surgery in 2016.  Healed scars on bilateral arms from work.  Has dry skin.  Patient has some college.  Patient works at [employer] since October 2020.  [employer] since 2017.  Patient is single, no children.  Stress is "everyone around me  I'm done with it all."    Fall risk information given to patient, low fall risk. Patient has been cooperative and pleasant.

## 2020-03-26 LAB — GLUCOSE, CAPILLARY: Glucose-Capillary: 108 mg/dL — ABNORMAL HIGH (ref 70–99)

## 2020-03-26 MED ORDER — HYDROXYZINE HCL 50 MG PO TABS
50.0000 mg | ORAL_TABLET | Freq: Once | ORAL | Status: AC
  Administered 2020-03-26: 50 mg via ORAL
  Filled 2020-03-26 (×2): qty 1

## 2020-03-26 NOTE — Progress Notes (Signed)
   03/26/20 0647  Vital Signs  Pulse Rate 81  BP 137/85  BP Location Right Arm  BP Method Automatic  Patient Position (if appropriate) Standing   D: Patient denies SI/HI/ AVH. Pt. Admits to a poor fitful sleep. Patient rated depression 10/10. A:  Patient took scheduled medicine.  Support and encouragement provided Routine safety checks conducted every 15 minutes. Patient  Informed to notify staff with any concerns.   R: Safety maintained.

## 2020-03-26 NOTE — BHH Suicide Risk Assessment (Signed)
[place] INPATIENT:  Family/Significant Other Suicide Prevention Education  Suicide Prevention Education:  Education Completed; Anthony Cox, mother [phone] has been identified by the patient as the family member/significant other with whom the patient will be residing, and identified as the person(s) who will aid the patient in the event of a mental health crisis (suicidal ideations/suicide attempt).  With written consent from the patient, the family member/significant other has been provided the following suicide prevention education, prior to the and/or following the discharge of the patient.  The suicide prevention education provided includes the following:  Suicide risk factors  Suicide prevention and interventions  National Suicide Hotline telephone number  [facility] assessment telephone number  [facility] Emergency Assistance [place] and/or Residential Mobile Crisis Unit telephone number  Request made of family/significant other to:  Remove weapons (e.g., guns, rifles, knives), all items previously/currently identified as safety concern.    Remove drugs/medications (over-the-counter, prescriptions, illicit drugs), all items previously/currently identified as a safety concern.  The family member/significant other verbalizes understanding of the suicide prevention education information provided.  The family member/significant other agrees to remove the items of safety concern listed above. Anthony Cox reports "he does not feel like he is where he should be in life and he fell in love with a girl that is promiscuous." She reports the girl is also sleeping with his friends. She reports on Saturday night, the pt came into her room and stated "I want to end my life" She states the pt has been keeping a video diary stating "he will end his life at the end of the year." Anthony Cox raises concern about the pt's relationship with the girl and reports the pt  has told her "the girl calms him and makes him crazy."She states pt father has a history of bipolar disorder. She reports the pt is a gun owner and has a sword collection that has been removed from the home and taken to his brothers home. She does not report any reservation about the pt returning to her home. Anthony Cox 03/26/2020, 3:52 PM

## 2020-03-26 NOTE — Progress Notes (Signed)
Collateral information from Winfred Burn @ [phone]- Mom states that he recently got involved with a girl through gaming and they were involved in sexual activities. Mom states that this girl is involved sexually with Pt's other friends also. Pt found out that she is involved with other men and his best friend and got upset. Mom states this girl doesn't share the same connection as him which made him really upset. Mom states that he talked to her recently and she started cursing at him a lot. He came to [facility] room on Saturday night, shaking, nervous, and sad and she felt like he was having a panic attack and he stated that he is going to kill himself by the end of the year and showed her videos of himself talking about his plan to commit suicide. Pt told her mother that he searched google and found that making videos about himself everyday helps with coping if someone is feeling suicidal. Mom thinks that it is all because of this girl is what makes him feel suicidal and depressed. Mom said he had a good attitude after he graduated high school but she noticed that he started becoming very angry about 5-6 months ago. She asked him to get help because of his father diagnosis of Bipolar but he didn't get any help. Mom states he doesn't sleep much and plays videogames with that girl at night. Mom denies Pt's previous suicidal attempts or hospitalizations.

## 2020-03-26 NOTE — BHH Counselor (Signed)
Adult Comprehensive Assessment  Patient ID: Anthony Cox, male   DOB: 04-11-1991, 29 y.o.   MRN: [id number]  Information Source: Information source: Patient  Current Stressors:  Patient states their primary concerns and needs for treatment are:: "Overwhelmed, hopeless, tired" Patient states their goals for this hospitilization and ongoing recovery are:: "Try to get better" Educational / Learning stressors: None reported Employment / Job issues: None reported Family Relationships: Pt reports his mother is on oxygen and at times can become overwhelmed caring for her Financial / Lack of resources (include bankruptcy): None reported Housing / Lack of housing: None reported Physical health (include injuries & life threatening diseases): Overweight, arthritis in knees Social relationships: Pt states he has people he grew up with since age 53 that he refers to as brother and sister Substance abuse: Pt reports marijuana use "once in a blue moon" 1 blunt  Living/Environment/Situation:  Living Arrangements: Parent Who else lives in the home?: Pt, mother, sister How long has patient lived in current situation?: "All my life" What is atmosphere in current home:  (Pt reports good and bad days, states getting overwhelmed by some of his mother's request)  Family History:  Marital status: Single Does patient have children?: No  Childhood History:  By whom was/is the patient raised?: Mother Description of patient's relationship with caregiver when they were a child: "Great" Patient's description of current relationship with people who raised him/her: "I feel stuck in place" Does patient have siblings?: No (Pt has a friend who he grew up with that he calls sister) Did patient suffer any verbal/emotional/physical/sexual abuse as a child?: No Did patient suffer from severe childhood neglect?: No Has patient ever been sexually abused/assaulted/raped as an adolescent or adult?: No Was the patient  ever a victim of a crime or a disaster?: No Witnessed domestic violence?: No Has patient been affected by domestic violence as an adult?: No  Education:  Highest grade of school patient has completed: Some college Currently a student?: No Learning disability?: No  Employment/Work Situation:   Employment situation: Employed 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 How long has patient been employed?: Since October 2020 Patient's job has been impacted by current illness: Yes Describe how patient's job has been impacted: "I have a hard time handling stress" Has patient ever been in the [employer]?: No  Financial Resources:   Museum/gallery curator resources: Income from employment, Medicare, Medicaid Does patient have a representative payee or guardian?: No  Alcohol/Substance Abuse:   What has been your use of drugs/alcohol within the last 12 months?: Marijuana If attempted suicide, did drugs/alcohol play a role in this?: No Alcohol/Substance Abuse Treatment Hx: Denies past history Has alcohol/substance abuse ever caused legal problems?: No  Social Support System:   Patient's Community Support System:  ("I dont know") Type of faith/religion: None reported  Leisure/Recreation:   Do You Have Hobbies?:  ("I dont know")  Strengths/Needs:   What is the patient's perception of their strengths?: "I dont know" Patient states these barriers may affect/interfere with their treatment: None reported Patient states these barriers may affect their return to the community: None reported  Discharge Plan:   Currently receiving community mental health services: No Patient states concerns and preferences for aftercare planning are: Pt request referral for therapy and medication management Patient states they will know when they are safe and ready for discharge when: "Being here has made me feel worse" Does patient have access to transportation?: Yes Does patient have financial barriers related  to  discharge medications?: No Will patient be returning to same living situation after discharge?: Yes (Lives with mother)  Summary/Recommendations:   Summary and Recommendations (to be completed by the evaluator): Pt is a 29 yr old male with a diagnosis of major depressive disorder, recurrent episode, severe who presents to the ED due to experiencing suicidal ideation. Chart review states pt made suicide videos and showed them to his mother. Pt reports owning two firearms and says they have been given to his "brother" Pt endorses feeling of hopelessness, overwhelmed and fatigue. Pt also endorses occasional marijuana use and denies any other drug or alcohol use. Pt denies having a mental health provider and request referral for therapy and medication management. Patient will benefit from crisis stabilization, medication evaluation, group therapy and psychoeducation, in addition to case management for discharge planning. At discharge it is recommended that patient adhere to the established discharge plan and continue in treatment.  Anthony Cox T Anthony Cox. 03/26/2020

## 2020-03-26 NOTE — Progress Notes (Signed)
Pt stated his stay her is increasing his depression, there's not much to do. 1:1 time spent with writer discussing anger management techniques and other coping strategies.    03/26/20 2100  Psych Admission Type (Psych Patients Only)  Admission Status Voluntary  Psychosocial Assessment  Patient Complaints Depression  Eye Contact Fair  Facial Expression Anxious;Sad;Worried  Affect Anxious;Depressed;Sad  Speech Logical/coherent  Interaction Assertive  Motor Activity Other (Comment) (WNL)  Appearance/Hygiene Disheveled  Behavior Characteristics Cooperative  Mood Depressed  Thought Process  Coherency WDL  Content WDL  Delusions None reported or observed  Perception Hallucinations  Hallucination Visual  Judgment Poor  Confusion None  Danger to Self  Current suicidal ideation? Passive  Self-Injurious Behavior No self-injurious ideation or behavior indicators observed or expressed   Agreement Not to Harm Self Yes  Description of Agreement verbally contracts for safety  Danger to Others  Danger to Others None reported or observed
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
## 2020-03-26 NOTE — Progress Notes (Signed)
NP, Lindon Romp had been notified of pt's elevated blood pressure of 158/108 with a pulse of 86. NP ordered a one time dose of 0.1 mg of clonidine to be administered. Pt was asked if he has a hx of HTN, he said "on and off." Pt was asked if he was taking any medications for it and said he wasn't at home, but had been prescribed something for it. Pt asked this writer to list a few names of blood pressure medications, but wasn't able to recall the name. Pt administered 0.1 of clonidine at 2128. Blood pressure and pulse were rechecked at 2239. Blood pressure is now stable at 123/83 with a pulse of 85.

## 2020-03-26 NOTE — Progress Notes (Signed)
Adult Psychoeducational Group Note  Date:  03/26/2020 Time:  5:01 AM  Group Topic/Focus:  Wrap-Up Group:   The focus of this group is to help patients review their daily goal of treatment and discuss progress on daily workbooks.  Participation Level:  Active  Participation Quality:  Appropriate  Affect:  Appropriate  Cognitive:  Appropriate  Insight: Appropriate  Engagement in Group:  Engaged  Modes of Intervention:  Discussion  Additional Comments:  Pt attend wrap up group his day was a 1. His goal was not to quite. Pt said he achieve his goal.   Barbette Hair 03/26/2020, 5:01 AM

## 2020-03-27 LAB — GLUCOSE, CAPILLARY: Glucose-Capillary: 93 mg/dL (ref 70–99)

## 2020-03-27 MED ORDER — LORAZEPAM 2 MG/ML IJ SOLN
2.0000 mg | INTRAMUSCULAR | Status: DC
  Administered 2020-03-27: 2 mg via INTRAMUSCULAR
  Filled 2020-03-27: qty 1

## 2020-03-27 MED ORDER — ZIPRASIDONE MESYLATE 20 MG IM SOLR
20.0000 mg | Freq: Once | INTRAMUSCULAR | Status: DC
  Administered 2020-03-27: 20 mg via INTRAMUSCULAR
  Filled 2020-03-27 (×2): qty 20

## 2020-03-27 MED ORDER — OLANZAPINE 5 MG PO TBDP
5.0000 mg | ORAL_TABLET | Freq: Every day | ORAL | Status: DC
  Filled 2020-03-27 (×3): qty 1

## 2020-03-27 MED ORDER — DIPHENHYDRAMINE HCL 50 MG/ML IJ SOLN
50.0000 mg | INTRAMUSCULAR | Status: DC
  Filled 2020-03-27: qty 1

## 2020-03-27 MED ORDER — LORAZEPAM 1 MG PO TABS: 1.0000 mg | ORAL_TABLET | ORAL | Status: DC | PRN

## 2020-03-27 MED ORDER — LORAZEPAM 1 MG PO TABS: 1.0000 mg | ORAL_TABLET | Freq: Four times a day (QID) | ORAL | Status: DC | PRN

## 2020-03-27 MED ORDER — OLANZAPINE 10 MG PO TBDP
10.0000 mg | ORAL_TABLET | Freq: Every day | ORAL | Status: DC
  Filled 2020-03-27 (×2): qty 1

## 2020-03-27 MED ORDER — BUPROPION HCL ER (XL) 150 MG PO TB24
150.0000 mg | ORAL_TABLET | Freq: Every day | ORAL | Status: DC
  Filled 2020-03-27 (×3): qty 1

## 2020-03-27 MED ORDER — DIPHENHYDRAMINE HCL 50 MG/ML IJ SOLN
INTRAMUSCULAR | Status: AC
  Administered 2020-03-27: 50 mg
  Filled 2020-03-27: qty 1

## 2020-03-27 MED ORDER — ZIPRASIDONE MESYLATE 20 MG IM SOLR: 20.0000 mg | Freq: Two times a day (BID) | INTRAMUSCULAR | Status: DC | PRN
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
## 2020-03-27 NOTE — Progress Notes (Signed)
Discharge Note:  Patient discharged home with mother to their home.  Patient denied SI and HI.  Contracts for safety.  No plan to hurt himself or someone else.  Denied A/V hallucinations.  Suicidal prevention information given to patient who stated he understood and had no questions, and again denied SI.  Patient stated he received all his belongings, clothing, toiletries, etc.  Patient stated he appreciated all assistance received from [facility] staff.  All required discharge information given to patient at discharge.  Patient denied SI, denied plan to commit suicide, denied intention to commit suicide.
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
## 2020-03-27 NOTE — Progress Notes (Signed)
D:  Patient's self inventory sheet, patient has poor sleep, sleep medication helpful.  Fair appetite, low energy level, poor concentration.  Rated  Depression and hopeless #10, denied anxiety.  Denied withdrawals.  SI, no plan, contracts for safety.  Denied physical problems  Denied physical pain.  Goal is discharge plan. Plans to sign out.  No discharge plans. A:  Medications administered per MD orders.  Emotional support and encouragement given patient. R:  Denied SI and HI, contracts for safety.  Denied A/V hallucinations.  Safety maintained with 15 minute checks.
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
## 2020-03-27 NOTE — BHH Suicide Risk Assessment (Signed)
[facility] Discharge Suicide Risk Assessment   Principal Problem: <principal problem not specified> Discharge Diagnoses: Active Problems:   MDD (major depressive disorder), recurrent episode, severe (HCC)   Suicidal ideation   Total Time spent with patient: 1 hour  Musculoskeletal: Strength & Muscle Tone: within normal limits Gait & Station: normal Patient leans: N/A  Psychiatric Specialty Exam: Review of Systems  Psychiatric/Behavioral: Positive for agitation and behavioral problems.  All other systems reviewed and are negative.   Blood pressure (!) 158/107, pulse 76, temperature 97.8 F (36.6 C), temperature source Oral, resp. rate 18, height 5\' 10"  (1.778 m), weight (!) 202.8 kg, SpO2 99 %.Body mass index is 64.14 kg/m.  General Appearance: Casual  Eye Contact::  Minimal  Speech:  Normal Rate409  Volume:  Normal  Mood:  Irritable  Affect:  Congruent  Thought Process:  Coherent and Descriptions of Associations: Circumstantial  Orientation:  Full (Time, Place, and Person)  Thought Content:  Rumination  Suicidal Thoughts:  No  Homicidal Thoughts:  No  Memory:  Immediate;   Fair Recent;   Fair Remote;   Fair  Judgement:  Impaired  Insight:  Lacking  Psychomotor Activity:  Normal  Concentration:  Fair  Recall:  [place]: Fair  Akathisia:  Negative  Handed:  Right  AIMS (if indicated):     Assets:  Desire for Improvement Housing Resilience Social Support  Sleep:  Number of Hours: 5.75  Cognition: WNL  ADL's:  Intact   Mental Status Per Nursing Assessment::   On Admission:  Suicidal ideation indicated by patient, Self-harm behaviors  Demographic Factors:  Male  Loss Factors: Loss of significant relationship  Historical Factors: Impulsivity  Risk Reduction Factors:   Employed, Living with another person, especially a relative and Positive social support  Continued Clinical Symptoms:  Depression:    Aggression Impulsivity  Cognitive Features That Contribute To Risk:  Thought constriction (tunnel vision)    Suicide Risk:  Minimal: No identifiable suicidal ideation.  Patients presenting with no risk factors but with morbid ruminations; may be classified as minimal risk based on the severity of the depressive symptoms   Follow-up [place] [facility] Follow up.   Specialty: Behavioral Health Contact information: [place] [phone]              Plan Of Care/Follow-up recommendations:  Activity:  ad lib  Sharma Covert, MD 03/27/2020, 10:53 AM
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
## 2020-03-27 NOTE — Progress Notes (Signed)
  [facility] Adult Case Management Discharge Plan :  Will you be returning to the same living situation after discharge:  Yes,  to home. At discharge, do you have transportation home?: Yes,  mother Do you have the ability to pay for your medications: Yes,  has insurance.   Release of information consent forms completed and in the chart;  Patient's signature needed at discharge.  Patient to Follow up at:  [facility]. Go on 04/09/2020.   Specialty: Behavioral Health Why: You have a walk in appointment for therapy on 04/09/20 at 3:45 pm. You also have a walk in appointment for medication management on 05/01/20 at 12:45 pm .  These appointments will be held in person.  Please arrive 15 minutes prior to your appointment. Contact information: [place] [phone]              Next level of care provider has access to [place] and Suicide Prevention discussed: Yes,  with mother.  Have you used any form of tobacco in the last 30 days? (Cigarettes, Smokeless Tobacco, Cigars, and/or Pipes): No  Has patient been referred to the Quitline?: N/A patient is not a smoker  Patient has been referred for addiction treatment: [place], LCSW 03/27/2020, 11:09 AM
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
## 2020-03-28 NOTE — BHH Counselor (Signed)
03/28/20 955am:  Pt's mother called requesting letter for hospital dates for return to work. CSW left letter with receptionist.

## 2020-12-23 DIAGNOSIS — M1712 Unilateral primary osteoarthritis, left knee: Secondary | ICD-10-CM | POA: Diagnosis not present

## 2021-01-23 DIAGNOSIS — H5213 Myopia, bilateral: Secondary | ICD-10-CM | POA: Diagnosis not present

## 2021-03-18 DIAGNOSIS — H5213 Myopia, bilateral: Secondary | ICD-10-CM | POA: Diagnosis not present

## 2021-07-16 DIAGNOSIS — M1711 Unilateral primary osteoarthritis, right knee: Secondary | ICD-10-CM | POA: Diagnosis not present

## 2021-07-16 DIAGNOSIS — M1712 Unilateral primary osteoarthritis, left knee: Secondary | ICD-10-CM | POA: Diagnosis not present

## 2021-07-22 DIAGNOSIS — J069 Acute upper respiratory infection, unspecified: Secondary | ICD-10-CM | POA: Diagnosis not present

## 2021-07-22 DIAGNOSIS — U071 COVID-19: Secondary | ICD-10-CM | POA: Diagnosis not present

## 2021-10-29 DIAGNOSIS — R059 Cough, unspecified: Secondary | ICD-10-CM | POA: Diagnosis not present

## 2022-02-26 DIAGNOSIS — M170 Bilateral primary osteoarthritis of knee: Secondary | ICD-10-CM | POA: Diagnosis not present

## 2022-02-26 DIAGNOSIS — M1712 Unilateral primary osteoarthritis, left knee: Secondary | ICD-10-CM | POA: Diagnosis not present

## 2022-02-26 DIAGNOSIS — M25561 Pain in right knee: Secondary | ICD-10-CM | POA: Diagnosis not present

## 2022-02-26 DIAGNOSIS — M1711 Unilateral primary osteoarthritis, right knee: Secondary | ICD-10-CM | POA: Diagnosis not present

## 2022-02-26 DIAGNOSIS — M25562 Pain in left knee: Secondary | ICD-10-CM | POA: Diagnosis not present

## 2022-05-05 DIAGNOSIS — H6991 Unspecified Eustachian tube disorder, right ear: Secondary | ICD-10-CM | POA: Diagnosis not present

## 2022-05-05 DIAGNOSIS — J019 Acute sinusitis, unspecified: Secondary | ICD-10-CM | POA: Diagnosis not present

## 2022-07-09 ENCOUNTER — Encounter (HOSPITAL_BASED_OUTPATIENT_CLINIC_OR_DEPARTMENT_OTHER): Payer: Self-pay | Admitting: Urology

## 2022-07-09 ENCOUNTER — Emergency Department (HOSPITAL_BASED_OUTPATIENT_CLINIC_OR_DEPARTMENT_OTHER)
Admission: EM | Admit: 2022-07-09 | Discharge: 2022-07-09 | Disposition: A | Discharge status: Home / Self Care | Payer: Medicare Other | Attending: Emergency Medicine | Admitting: Emergency Medicine

## 2022-07-09 ENCOUNTER — Other Ambulatory Visit: Payer: Self-pay

## 2022-07-09 DIAGNOSIS — Z1152 Encounter for screening for COVID-19: Secondary | ICD-10-CM | POA: Insufficient documentation

## 2022-07-09 DIAGNOSIS — J101 Influenza due to other identified influenza virus with other respiratory manifestations: Secondary | ICD-10-CM | POA: Diagnosis not present

## 2022-07-09 DIAGNOSIS — R000 Tachycardia, unspecified: Secondary | ICD-10-CM | POA: Diagnosis not present

## 2022-07-09 DIAGNOSIS — R739 Hyperglycemia, unspecified: Secondary | ICD-10-CM | POA: Diagnosis not present

## 2022-07-09 DIAGNOSIS — B349 Viral infection, unspecified: Secondary | ICD-10-CM | POA: Diagnosis not present

## 2022-07-09 DIAGNOSIS — D72829 Elevated white blood cell count, unspecified: Secondary | ICD-10-CM | POA: Diagnosis not present

## 2022-07-09 DIAGNOSIS — J029 Acute pharyngitis, unspecified: Secondary | ICD-10-CM | POA: Diagnosis present

## 2022-07-09 DIAGNOSIS — E1165 Type 2 diabetes mellitus with hyperglycemia: Secondary | ICD-10-CM | POA: Diagnosis not present

## 2022-07-09 LAB — CBC WITH DIFFERENTIAL/PLATELET
Abs Immature Granulocytes: 0.04 10*3/uL (ref 0.00–0.07)
Basophils Absolute: 0 10*3/uL (ref 0.0–0.1)
Basophils Relative: 0 %
Eosinophils Absolute: 0 10*3/uL (ref 0.0–0.5)
Eosinophils Relative: 0 %
HCT: 45.6 % (ref 39.0–52.0)
Hemoglobin: 14.9 g/dL (ref 13.0–17.0)
Immature Granulocytes: 0 %
Lymphocytes Relative: 11 %
Lymphs Abs: 1.2 10*3/uL (ref 0.7–4.0)
MCH: 26.8 pg (ref 26.0–34.0)
MCHC: 32.7 g/dL (ref 30.0–36.0)
MCV: 82.2 fL (ref 80.0–100.0)
Monocytes Absolute: 0.6 10*3/uL (ref 0.1–1.0)
Monocytes Relative: 5 %
Neutro Abs: 8.9 10*3/uL — ABNORMAL HIGH (ref 1.7–7.7)
Neutrophils Relative %: 84 %
Platelets: 242 10*3/uL (ref 150–400)
RBC: 5.55 MIL/uL (ref 4.22–5.81)
RDW: 14.6 % (ref 11.5–15.5)
WBC: 10.7 10*3/uL — ABNORMAL HIGH (ref 4.0–10.5)
nRBC: 0 % (ref 0.0–0.2)

## 2022-07-09 LAB — BASIC METABOLIC PANEL
Anion gap: 8 (ref 5–15)
BUN: 10 mg/dL (ref 6–20)
CO2: 27 mmol/L (ref 22–32)
Calcium: 8.9 mg/dL (ref 8.9–10.3)
Chloride: 102 mmol/L (ref 98–111)
Creatinine, Ser: 0.81 mg/dL (ref 0.61–1.24)
GFR, Estimated: >60 mL/min (ref >60)
Glucose, Bld: 162 mg/dL — ABNORMAL HIGH (ref 70–99)
Potassium: 3.7 mmol/L (ref 3.5–5.1)
Sodium: 137 mmol/L (ref 135–145)

## 2022-07-09 LAB — GROUP A STREP BY PCR: Group A Strep by PCR: NOT DETECTED

## 2022-07-09 LAB — RESP PANEL BY RT-PCR (FLU A&B, COVID) ARPGX2
Influenza A by PCR: NEGATIVE
Influenza B by PCR: NEGATIVE
SARS Coronavirus 2 by RT PCR: NEGATIVE

## 2022-07-09 LAB — CBG MONITORING, ED: Glucose-Capillary: 168 mg/dL — ABNORMAL HIGH (ref 70–99)

## 2022-07-09 MED ORDER — KETOROLAC TROMETHAMINE 15 MG/ML IJ SOLN
30.0000 mg | Freq: Once | INTRAMUSCULAR | Status: AC
  Administered 2022-07-09: 30 mg via INTRAMUSCULAR
  Filled 2022-07-09: qty 2

## 2022-07-09 NOTE — ED Notes (Signed)
Pt. Reports he suddenly felt weak and tired today and had a small bout of diarrhea.  Pt. Has had no vomiting or nausea per Pt.  Pt. In no distress at present time.

## 2022-07-09 NOTE — ED Provider Notes (Signed)
[facility] EMERGENCY DEPARTMENT Provider Note   CSN: [id number] Arrival date & time: 07/09/22  1635     History  Chief Complaint  Patient presents with   Flu like symptoms     Anthony Cox is a 31 y.o. male.  Patient with history of sleep apnea, diabetes but no current treatments --presents to the emergency department for evaluation of sore throat, body aches and generalized weakness.  Patient reports having a respiratory infection a month ago and was placed on antibiotics.  He states that he had a sore throat with that illness which never fully improved, however it did not interfere with daily activities.  Today he awoke with a more severe sore throat.  He tried to work and felt very weak and had a headache.  He has had diarrhea today but no vomiting.  No ear pain or runny nose.  No significant cough.  Reports frequent urination.    Home Medications Prior to Admission medications   Not on File      Allergies    Patient has no known allergies.    Review of Systems   Review of Systems  Physical Exam Updated Vital Signs BP (!) 152/83 (BP Location: Left Arm)   Pulse (!) 111   Temp 98 F (36.7 C) (Oral)   Resp 18   Ht '5\' 10"'$  (1.778 m)   Wt (!) 202.8 kg   SpO2 97%   BMI 64.15 kg/m   Physical Exam Vitals and nursing note reviewed.  Constitutional:      General: He is not in acute distress.    Appearance: He is well-developed.  HENT:     Head: Normocephalic and atraumatic.     Right Ear: External ear normal.     Left Ear: External ear normal.     Nose: Nose normal.     Mouth/Throat:     Mouth: Mucous membranes are moist.     Pharynx: Posterior oropharyngeal erythema present. No pharyngeal swelling, oropharyngeal exudate or uvula swelling.  Eyes:     General:        Right eye: No discharge.        Left eye: No discharge.     Conjunctiva/sclera: Conjunctivae normal.  Cardiovascular:     Rate and Rhythm: Regular rhythm. Tachycardia present.      Heart sounds: Normal heart sounds.  Pulmonary:     Effort: Pulmonary effort is normal.     Breath sounds: Normal breath sounds.  Abdominal:     Palpations: Abdomen is soft.     Tenderness: There is no abdominal tenderness. There is no guarding or rebound.  Musculoskeletal:     Cervical back: Normal range of motion and neck supple.  Skin:    General: Skin is warm and dry.  Neurological:     Mental Status: He is alert.     ED Results / Procedures / Treatments   Labs (all labs ordered are listed, but only abnormal results are displayed) Labs Reviewed  CBC WITH DIFFERENTIAL/PLATELET - Abnormal; Notable for the following components:      Result Value   WBC 10.7 (*)    Neutro Abs 8.9 (*)    All other components within normal limits  BASIC METABOLIC PANEL - Abnormal; Notable for the following components:   Glucose, Bld 162 (*)    All other components within normal limits  CBG MONITORING, ED - Abnormal; Notable for the following components:   Glucose-Capillary 168 (*)    All other  components within normal limits  RESP PANEL BY RT-PCR (FLU A&B, COVID) ARPGX2  GROUP A STREP BY PCR  URINALYSIS, ROUTINE W REFLEX MICROSCOPIC    EKG EKG Interpretation  Date/Time:  Thursday July 09 2022 16:56:26 EST Ventricular Rate:  108 PR Interval:  144 QRS Duration: 82 QT Interval:  318 QTC Calculation: 426 R Axis:   58 Text Interpretation: Sinus tachycardia Otherwise normal ECG When compared with ECG of 24-Mar-2020 22:49, PREVIOUS ECG IS PRESENT Confirmed by Nanda Quinton [phone]) on 07/09/2022 5:06:57 PM  Radiology No results found.  Procedures Procedures    Medications Ordered in ED Medications  ketorolac (TORADOL) 15 MG/ML injection 30 mg (has no administration in time range)    ED Course/ Medical Decision Making/ A&P    Patient seen and examined. History obtained directly from patient. Work-up including labs, imaging, EKG ordered in triage, if performed, were reviewed.     Labs/EKG: Independently reviewed and interpreted.  This included: Strep, flu, COVID-negative.  Blood sugar elevated in the 160s.  Imaging: None ordered  Medications/Fluids: Ordered: '30mg'$  IM Toradol for headache and body pain  Most recent vital signs reviewed and are as follows: BP (!) 152/83 (BP Location: Left Arm)   Pulse (!) 111   Temp 98 F (36.7 C) (Oral)   Resp 18   Ht '5\' 10"'$  (1.778 m)   Wt (!) 202.8 kg   SpO2 97%   BMI 64.15 kg/m   Initial impression: Weakness, likely viral infection given sore throat.  However patient is tachycardic and has risk factors including untreated diabetes.  Will check lab work and UA.  6:59 PM Reassessment performed. Patient appears stable.  Labs personally reviewed and interpreted including: CBC with white blood cell count 10.7 mildly elevated; BMP glucose 162 with normal anion gap and normal electrolytes and kidney function.  Reviewed pertinent lab work and imaging with patient at bedside. Questions answered.  Discussed with patient that he needs to have his blood sugar rechecked in the future after he is feeling better and treated if necessary.  Most current vital signs reviewed and are as follows: BP (!) 152/83 (BP Location: Left Arm)   Pulse (!) 111   Temp 98 F (36.7 C) (Oral)   Resp 18   Ht '5\' 10"'$  (1.778 m)   Wt (!) 202.8 kg   SpO2 97%   BMI 64.15 kg/m   Plan: Discharge to home.   Prescriptions written for: None  Other home care instructions discussed: OTC medications, rest, hydration  ED return instructions discussed: Worsening shortness of breath, trouble breathing, fever  Follow-up instructions discussed: Patient encouraged to follow-up with their PCP in 5 days if not improving and then at some point follow-up for blood sugar in the next several weeks.                          Medical Decision Making Risk Prescription drug management.   Patient with symptoms consistent with a viral syndrome. Vitals are stable, no  fever. No signs of dehydration.  Blood sugar elevated but no signs of DKA or ketosis.  Low concern for pneumonia, lung exam normal. Supportive therapy indicated with return if symptoms worsen.    The patient's vital signs, pertinent lab work and imaging were reviewed and interpreted as discussed in the ED course. Hospitalization was considered for further testing, treatments, or serial exams/observation. However as patient is well-appearing, has a stable exam, and reassuring studies today, I do not feel  that they warrant admission at this time. This plan was discussed with the patient who verbalizes agreement and comfort with this plan and seems reliable and able to return to the Emergency Department with worsening or changing symptoms.          Final Clinical Impression(s) / ED Diagnoses Final diagnoses:  Viral infection  Hyperglycemia    Rx / DC Orders ED Discharge Orders     None         Carlisle Cater, PA-C 07/09/22 Sheralyn Boatman, MD 07/10/22 1001

## 2022-07-09 NOTE — Discharge Instructions (Addendum)
Please read and follow all provided instructions.  Your diagnoses today include:  1. Viral infection     Tests performed today include: Flu and COVID testing: Negative Complete blood cell count: Normal blood cell counts Basic metabolic panel: Blood sugar elevated into the 300T, but no complications from this Vital signs. See below for your results today.   Medications prescribed:  Please use over-the-counter NSAID medications (ibuprofen, naproxen) or Tylenol (acetaminophen) as directed on the packaging for pain -- as long as you do not have any reasons avoid these medications. Reasons to avoid NSAID medications include: weak kidneys, a history of bleeding in your stomach or gut, or uncontrolled high blood pressure or previous heart attack. Reasons to avoid Tylenol include: liver problems or ongoing alcohol use. Never take more than '4000mg'$  or 8 Extra strength Tylenol in a 24 hour period.     Take any prescribed medications only as directed.  Home care instructions:  Follow any educational materials contained in this packet.  BE VERY CAREFUL not to take multiple medicines containing Tylenol (also called acetaminophen). Doing so can lead to an overdose which can damage your liver and cause liver failure and possibly death.   Follow-up instructions: Please follow-up with your primary care provider in the next 5 days for further evaluation of your symptoms.   Return instructions:  Please return to the Emergency Department if you experience worsening symptoms.  Return with worsening trouble breathing, difficulty swallowing, vomiting, high fever or other concerns Please return if you have any other emergent concerns.  Additional Information:  Your vital signs today were: BP (!) 152/83 (BP Location: Left Arm)   Pulse (!) 111   Temp 98 F (36.7 C) (Oral)   Resp 18   Ht '5\' 10"'$  (1.778 m)   Wt (!) 202.8 kg   SpO2 97%   BMI 64.15 kg/m  If your blood pressure (BP) was elevated above  135/85 this visit, please have this repeated by your doctor within one month. --------------

## 2022-07-09 NOTE — ED Triage Notes (Signed)
Pt states sore throat x 1 month, pt was on antiobiotic for URI and finished it 2 weeks ago (unknown med)  States today felt weak at work, states throat still hurting and ha  Pt states frequent urination, h/o dm but does not take meds for it

## 2022-07-10 DIAGNOSIS — R600 Localized edema: Secondary | ICD-10-CM | POA: Diagnosis not present

## 2022-07-10 DIAGNOSIS — J40 Bronchitis, not specified as acute or chronic: Secondary | ICD-10-CM | POA: Diagnosis not present

## 2022-07-10 DIAGNOSIS — J029 Acute pharyngitis, unspecified: Secondary | ICD-10-CM | POA: Diagnosis not present

## 2022-07-10 DIAGNOSIS — Z9884 Bariatric surgery status: Secondary | ICD-10-CM | POA: Diagnosis not present

## 2022-08-04 ENCOUNTER — Emergency Department (HOSPITAL_BASED_OUTPATIENT_CLINIC_OR_DEPARTMENT_OTHER)
Admission: EM | Admit: 2022-08-04 | Discharge: 2022-08-04 | Disposition: A | Discharge status: Home / Self Care | Payer: Medicare Other | Attending: Emergency Medicine | Admitting: Emergency Medicine

## 2022-08-04 ENCOUNTER — Encounter (HOSPITAL_BASED_OUTPATIENT_CLINIC_OR_DEPARTMENT_OTHER): Payer: Self-pay | Admitting: Emergency Medicine

## 2022-08-04 DIAGNOSIS — E119 Type 2 diabetes mellitus without complications: Secondary | ICD-10-CM | POA: Insufficient documentation

## 2022-08-04 DIAGNOSIS — I10 Essential (primary) hypertension: Secondary | ICD-10-CM | POA: Insufficient documentation

## 2022-08-04 DIAGNOSIS — U071 COVID-19: Secondary | ICD-10-CM | POA: Diagnosis not present

## 2022-08-04 DIAGNOSIS — J111 Influenza due to unidentified influenza virus with other respiratory manifestations: Secondary | ICD-10-CM

## 2022-08-04 DIAGNOSIS — R059 Cough, unspecified: Secondary | ICD-10-CM | POA: Diagnosis present

## 2022-08-04 DIAGNOSIS — J101 Influenza due to other identified influenza virus with other respiratory manifestations: Secondary | ICD-10-CM | POA: Insufficient documentation

## 2022-08-04 LAB — RESP PANEL BY RT-PCR (RSV, FLU A&B, COVID)  RVPGX2
Influenza A by PCR: POSITIVE — AB
Influenza B by PCR: NEGATIVE
Resp Syncytial Virus by PCR: NEGATIVE
SARS Coronavirus 2 by RT PCR: POSITIVE — AB

## 2022-08-04 MED ORDER — ONDANSETRON 4 MG PO TBDP
4.0000 mg | ORAL_TABLET | Freq: Once | ORAL | Status: DC
  Filled 2022-08-04: qty 1

## 2022-08-04 MED ORDER — OSELTAMIVIR PHOSPHATE 75 MG PO CAPS: 75.0000 mg | ORAL_CAPSULE | Freq: Two times a day (BID) | ORAL | 0 refills | Status: AC

## 2022-08-04 MED ORDER — ONDANSETRON HCL 4 MG PO TABS: 4.0000 mg | ORAL_TABLET | Freq: Four times a day (QID) | ORAL | 0 refills | Status: AC

## 2022-08-04 MED ORDER — ACETAMINOPHEN 500 MG PO TABS
1000.0000 mg | ORAL_TABLET | ORAL | Status: AC
  Administered 2022-08-04: 1000 mg via ORAL
  Filled 2022-08-04: qty 2

## 2022-08-04 MED ORDER — NIRMATRELVIR/RITONAVIR (PAXLOVID)TABLET: 3.0000 | ORAL_TABLET | Freq: Two times a day (BID) | ORAL | 0 refills | Status: AC

## 2022-08-04 NOTE — ED Notes (Signed)
Discharge instructions reviewed with patient. Patient verbalizes understanding, no further questions at this time. Medications/prescriptions and follow up information provided. No acute distress noted at time of departure.  

## 2022-08-04 NOTE — ED Notes (Signed)
Provided with grape juice per request

## 2022-08-04 NOTE — Discharge Instructions (Addendum)
You were seen for your COVID and influenza in the emergency department.   At home, please take Zofran for nausea and vomiting.  Please take Tamiflu and Paxlovid to treat your COVID and influenza.  You may take over-the-counter medications to treat your other flu like symptoms.  Follow-up with your primary doctor in 2-3 days regarding your visit.    Return immediately to the emergency department if you experience any of the following: Difficulty breathing, chest pain, or any other concerning symptoms.    Thank you for visiting our Emergency Department. It was a pleasure taking care of you today.

## 2022-08-04 NOTE — ED Triage Notes (Signed)
Patient presents C/O cough, congestion, subjective fever/chills. No meds taken PTA

## 2022-08-04 NOTE — ED Provider Notes (Signed)
  [facility] Provider Note   CSN: [id number] Arrival date & time: 08/04/22  4098     History {Add pertinent medical, surgical, social history, OB history to HPI:1} Chief Complaint  Patient presents with   Cough    Anthony Cox is a 31 y.o. male.  DM HTN anemia osa Hasn't tried and meds Symptoms started yesterday back aches, cough, mild sob, no chest pains, congestion URI several weeks ago but negative covid/flu       Home Medications Prior to Admission medications   Not on File      Allergies    Patient has no known allergies.    Review of Systems   Review of Systems  Physical Exam Updated Vital Signs BP (!) 145/87 (BP Location: Right Arm)   Pulse (!) 103   Temp 99.8 F (37.7 C) (Oral)   Resp (!) 24   Ht '5\' 10"'$  (1.778 m)   Wt (!) 204.1 kg   SpO2 97%   BMI 64.57 kg/m  Physical Exam  ED Results / Procedures / Treatments   Labs (all labs ordered are listed, but only abnormal results are displayed) Labs Reviewed  RESP PANEL BY RT-PCR (RSV, FLU A&B, COVID)  RVPGX2 - Abnormal; Notable for the following components:      Result Value   SARS Coronavirus 2 by RT PCR POSITIVE (*)    Influenza A by PCR POSITIVE (*)    All other components within normal limits    EKG None  Radiology No results found.  Procedures Procedures  {Document cardiac monitor, telemetry assessment procedure when appropriate:1}  Medications Ordered in ED Medications - No data to display  ED Course/ Medical Decision Making/ A&P                           Medical Decision Making  ***  {Document critical care time when appropriate:1} {Document review of labs and clinical decision tools ie heart score, Chads2Vasc2 etc:1}  {Document your independent review of radiology images, and any outside records:1} {Document your discussion with family members, caretakers, and with consultants:1} {Document social determinants of health affecting pt's  care:1} {Document your decision making why or why not admission, treatments were needed:1} Final Clinical Impression(s) / ED Diagnoses Final diagnoses:  None    Rx / DC Orders ED Discharge Orders     None

## 2022-08-07 DIAGNOSIS — U071 COVID-19: Secondary | ICD-10-CM | POA: Diagnosis not present

## 2022-08-07 DIAGNOSIS — J40 Bronchitis, not specified as acute or chronic: Secondary | ICD-10-CM | POA: Diagnosis not present

## 2022-11-12 ENCOUNTER — Encounter (HOSPITAL_BASED_OUTPATIENT_CLINIC_OR_DEPARTMENT_OTHER): Payer: Self-pay | Admitting: *Deleted

## 2022-11-12 ENCOUNTER — Emergency Department (HOSPITAL_BASED_OUTPATIENT_CLINIC_OR_DEPARTMENT_OTHER)
Admission: EM | Admit: 2022-11-12 | Discharge: 2022-11-12 | Disposition: A | Discharge status: Home / Self Care | Payer: 59 | Attending: Emergency Medicine | Admitting: Emergency Medicine

## 2022-11-12 ENCOUNTER — Other Ambulatory Visit: Payer: Self-pay

## 2022-11-12 DIAGNOSIS — R197 Diarrhea, unspecified: Secondary | ICD-10-CM | POA: Insufficient documentation

## 2022-11-12 DIAGNOSIS — E119 Type 2 diabetes mellitus without complications: Secondary | ICD-10-CM | POA: Insufficient documentation

## 2022-11-12 DIAGNOSIS — Z79899 Other long term (current) drug therapy: Secondary | ICD-10-CM | POA: Diagnosis not present

## 2022-11-12 DIAGNOSIS — I10 Essential (primary) hypertension: Secondary | ICD-10-CM | POA: Insufficient documentation

## 2022-11-12 DIAGNOSIS — U071 COVID-19: Secondary | ICD-10-CM | POA: Diagnosis not present

## 2022-11-12 DIAGNOSIS — J029 Acute pharyngitis, unspecified: Secondary | ICD-10-CM | POA: Diagnosis present

## 2022-11-12 LAB — COMPREHENSIVE METABOLIC PANEL
ALT: 46 U/L — ABNORMAL HIGH (ref 0–44)
AST: 64 U/L — ABNORMAL HIGH (ref 15–41)
Albumin: 3.8 g/dL (ref 3.5–5.0)
Alkaline Phosphatase: 51 U/L (ref 38–126)
Anion gap: 11 (ref 5–15)
BUN: 8 mg/dL (ref 6–20)
CO2: 20 mmol/L — ABNORMAL LOW (ref 22–32)
Calcium: 8.3 mg/dL — ABNORMAL LOW (ref 8.9–10.3)
Chloride: 104 mmol/L (ref 98–111)
Creatinine, Ser: 0.75 mg/dL (ref 0.61–1.24)
GFR, Estimated: >60 mL/min (ref >60)
Glucose, Bld: 137 mg/dL — ABNORMAL HIGH (ref 70–99)
Potassium: 3.7 mmol/L (ref 3.5–5.1)
Sodium: 135 mmol/L (ref 135–145)
Total Bilirubin: 1.1 mg/dL (ref 0.3–1.2)
Total Protein: 7.8 g/dL (ref 6.5–8.1)

## 2022-11-12 LAB — URINALYSIS, MICROSCOPIC (REFLEX)

## 2022-11-12 LAB — CBC WITH DIFFERENTIAL/PLATELET
Abs Immature Granulocytes: 0.01 10*3/uL (ref 0.00–0.07)
Basophils Absolute: 0 10*3/uL (ref 0.0–0.1)
Basophils Relative: 1 %
Eosinophils Absolute: 0.1 10*3/uL (ref 0.0–0.5)
Eosinophils Relative: 1 %
HCT: 45.3 % (ref 39.0–52.0)
Hemoglobin: 14.9 g/dL (ref 13.0–17.0)
Immature Granulocytes: 0 %
Lymphocytes Relative: 19 %
Lymphs Abs: 0.7 10*3/uL (ref 0.7–4.0)
MCH: 26.6 pg (ref 26.0–34.0)
MCHC: 32.9 g/dL (ref 30.0–36.0)
MCV: 80.9 fL (ref 80.0–100.0)
Monocytes Absolute: 0.6 10*3/uL (ref 0.1–1.0)
Monocytes Relative: 14 %
Neutro Abs: 2.5 10*3/uL (ref 1.7–7.7)
Neutrophils Relative %: 65 %
Platelets: 202 10*3/uL (ref 150–400)
RBC: 5.6 MIL/uL (ref 4.22–5.81)
RDW: 14.5 % (ref 11.5–15.5)
WBC: 3.9 10*3/uL — ABNORMAL LOW (ref 4.0–10.5)
nRBC: 0 % (ref 0.0–0.2)

## 2022-11-12 LAB — URINALYSIS, ROUTINE W REFLEX MICROSCOPIC
Bilirubin Urine: NEGATIVE
Glucose, UA: NEGATIVE mg/dL
Ketones, ur: NEGATIVE mg/dL
Leukocytes,Ua: NEGATIVE
Nitrite: NEGATIVE
Protein, ur: >=300 mg/dL — AB
Specific Gravity, Urine: >=1.03 (ref 1.005–1.030)
pH: 5.5 (ref 5.0–8.0)

## 2022-11-12 LAB — LIPASE, BLOOD: Lipase: 29 U/L (ref 11–51)

## 2022-11-12 LAB — SARS CORONAVIRUS 2 BY RT PCR: SARS Coronavirus 2 by RT PCR: POSITIVE — AB

## 2022-11-12 MED ORDER — DICYCLOMINE HCL 20 MG PO TABS: 20.0000 mg | ORAL_TABLET | Freq: Two times a day (BID) | ORAL | 0 refills | Status: AC | PRN

## 2022-11-12 MED ORDER — PAXLOVID (300/100) 20 X 150 MG & 10 X 100MG PO TBPK: 3.0000 | ORAL_TABLET | Freq: Two times a day (BID) | ORAL | 0 refills | Status: AC

## 2022-11-12 MED ORDER — LOPERAMIDE HCL 2 MG PO CAPS
4.0000 mg | ORAL_CAPSULE | Freq: Once | ORAL | Status: AC
  Administered 2022-11-12: 4 mg via ORAL
  Filled 2022-11-12: qty 2

## 2022-11-12 MED ORDER — SODIUM CHLORIDE 0.9 % IV BOLUS
1000.0000 mL | Freq: Once | INTRAVENOUS | Status: AC
  Administered 2022-11-12: 1000 mL via INTRAVENOUS

## 2022-11-12 MED ORDER — DICYCLOMINE HCL 10 MG PO CAPS
10.0000 mg | ORAL_CAPSULE | Freq: Once | ORAL | Status: AC
  Administered 2022-11-12: 10 mg via ORAL
  Filled 2022-11-12: qty 1

## 2022-11-12 NOTE — ED Triage Notes (Signed)
Pt began feeling unwell and weak Monday after a coworker was sick with URI symptoms.  Pt began taking nyquil, dayquil, tyenol and mucinex. Pt was having productive cough and continued feeling weak, Tuesday pt began having watery diarrhea and he continues to have worsening diarrhea and was incontinent of stool in his bed this am.  Pt has had 6 episodes of diarrhea during the night.

## 2022-11-12 NOTE — ED Provider Notes (Signed)
EMERGENCY DEPARTMENT AT [facility] Provider Note   CSN: [id number] Arrival date & time: 11/12/22  1014     History  Chief Complaint  Patient presents with   Diarrhea    Anthony Cox is a 32 y.o. male.   Diarrhea   32 year old male presents emergency department with complaints of diarrhea.  Patient states the illness initially began on Monday with cough, nasal congestion, mildly sore throat.  States illnesses going around his work of similar nature.  Reports diarrhea beginning on Tuesday.  States he has been taking at home Mucinex, NyQuil which has been helping with respiratory type symptoms but has not been helping with diarrhea.  Notes 6+ episodes of loose bowel movements each day since onset.  Describes stools as "watery."  Reports some cramping type abdominal discomfort prior to 2 bowel movements last night of which is relieved after bowel movement.  Denies fever, chills, chest pain, shortness of breath, urinary symptoms, nausea, vomiting, hematochezia/melena.  Patient currently without abdominal pain.  Prior abdominal surgeries include gastric sleeve in 2016.  Denies any recent travel/hiking/camping, antibiotic use.  Past medical history significant for OSA, obesity, diabetes mellitus, GERD  Home Medications Prior to Admission medications   Medication Sig Start Date End Date Taking? Authorizing Provider  dicyclomine (BENTYL) 20 MG tablet Take 1 tablet (20 mg total) by mouth 2 (two) times daily as needed for spasms. 11/12/22  Yes Dion Saucier A, PA  nirmatrelvir & ritonavir (PAXLOVID, 300/100,) 20 x 150 MG & 10 x 100MG  TBPK Take 3 tablets by mouth 2 (two) times daily for 5 days. 11/12/22 11/17/22 Yes Dion Saucier A, PA  ondansetron (ZOFRAN) 4 MG tablet Take 1 tablet (4 mg total) by mouth every 6 (six) hours. 08/04/22   Fransico Meadow, MD  oseltamivir (TAMIFLU) 75 MG capsule Take 1 capsule (75 mg total) by mouth every 12 (twelve) hours. 08/04/22    Fransico Meadow, MD      Allergies    Patient has no known allergies.    Review of Systems   Review of Systems  Gastrointestinal:  Positive for diarrhea.  All other systems reviewed and are negative.   Physical Exam Updated Vital Signs BP (!) 142/80 (BP Location: Left Arm)   Pulse 93   Temp 98.2 F (36.8 C) (Oral)   Resp 18   Wt (!) 204.1 kg   SpO2 99%   BMI 64.57 kg/m  Physical Exam Vitals and nursing note reviewed.  Constitutional:      General: He is not in acute distress.    Appearance: He is well-developed.  HENT:     Head: Normocephalic and atraumatic.     Nose: Congestion and rhinorrhea present.     Mouth/Throat:     Comments: Mild posterior pharyngeal erythema.  Uvula midline with symmetrical phonation.  Tonsils are 1+ bilaterally with no obvious exudate.  No sublingual or submandibular swelling appreciated.  No obvious trismus or changes in phonation. Eyes:     Conjunctiva/sclera: Conjunctivae normal.  Cardiovascular:     Rate and Rhythm: Normal rate and regular rhythm.     Pulses: Normal pulses.     Heart sounds: No murmur heard. Pulmonary:     Effort: Pulmonary effort is normal. No respiratory distress.     Breath sounds: Normal breath sounds. No wheezing, rhonchi or rales.  Abdominal:     Palpations: Abdomen is soft.     Tenderness: There is no abdominal tenderness. There is no right CVA  tenderness or left CVA tenderness.  Musculoskeletal:        General: No swelling.     Cervical back: Neck supple.     Right lower leg: No edema.     Left lower leg: No edema.  Skin:    General: Skin is warm and dry.     Capillary Refill: Capillary refill takes less than 2 seconds.  Neurological:     Mental Status: He is alert.  Psychiatric:        Mood and Affect: Mood normal.     ED Results / Procedures / Treatments   Labs (all labs ordered are listed, but only abnormal results are displayed) Labs Reviewed  SARS CORONAVIRUS 2 BY RT PCR - Abnormal;  Notable for the following components:      Result Value   SARS Coronavirus 2 by RT PCR POSITIVE (*)    All other components within normal limits  COMPREHENSIVE METABOLIC PANEL - Abnormal; Notable for the following components:   CO2 20 (*)    Glucose, Bld 137 (*)    Calcium 8.3 (*)    AST 64 (*)    ALT 46 (*)    All other components within normal limits  URINALYSIS, ROUTINE W REFLEX MICROSCOPIC - Abnormal; Notable for the following components:   Color, Urine AMBER (*)    APPearance HAZY (*)    Hgb urine dipstick SMALL (*)    Protein, ur >=300 (*)    All other components within normal limits  CBC WITH DIFFERENTIAL/PLATELET - Abnormal; Notable for the following components:   WBC 3.9 (*)    All other components within normal limits  URINALYSIS, MICROSCOPIC (REFLEX) - Abnormal; Notable for the following components:   Bacteria, UA RARE (*)    All other components within normal limits  LIPASE, BLOOD    EKG None  Radiology No results found.  Procedures Procedures    Medications Ordered in ED Medications  loperamide (IMODIUM) capsule 4 mg (4 mg Oral Given 11/12/22 1128)  sodium chloride 0.9 % bolus 1,000 mL (1,000 mLs Intravenous New Bag/Given 11/12/22 1135)  dicyclomine (BENTYL) capsule 10 mg (10 mg Oral Given 11/12/22 1128)    ED Course/ Medical Decision Making/ A&P Clinical Course as of 11/12/22 1155  Thu Nov 12, 2022  1113 Tuesday. URI illness.  Mucinex, nyquil  [CR]    Clinical Course User Index [CR] Wilnette Kales, PA                             Medical Decision Making Amount and/or Complexity of Data Reviewed Labs: ordered.  Risk Prescription drug management.   This patient presents to the ED for concern of diarrhea, URI symptoms, this involves an extensive number of treatment options, and is a complaint that carries with it a high risk of complications and morbidity.  The differential diagnosis includes influenza, COVID, RSV, pneumonia,  gastroenteritis,   Co morbidities that complicate the patient evaluation  See HPI   Additional history obtained:  Additional history obtained from EMR External records from outside source obtained and reviewed including hospital records   Lab Tests:  I Ordered, and personally interpreted labs.  The pertinent results include: Mild leukopenia of 3.9.  No evidence of anemia.  Platelets within normal range.  Decrease in bicarb and mild hypocalcemia of 20 and 8.3 respectively.  No other electrolyte abnormalities.  Mild transaminitis of AST of 64 and ALT of 46 of which  patient has no right upper quadrant tenderness.  No renal dysfunction.  UA significant for rare bacteria, greater than 300 proteins, small hemoglobin but otherwise unremarkable; patient currently without urinary symptoms with negative leuks and nitrites will withhold for treatment of UTI.   Imaging Studies ordered:  N/a   Cardiac Monitoring: / EKG:  The patient was maintained on a cardiac monitor.  I personally viewed and interpreted the cardiac monitored which showed an underlying rhythm of: Sinus rhythm   Consultations Obtained:  N/a   Problem List / ED Course / Critical interventions / Medication management  COVID, diarrhea I ordered medication including Bentyl, Imodium, 1 L normal saline  Reevaluation of the patient after these medicines showed that the patient improved I have reviewed the patients home medicines and have made adjustments as needed   Social Determinants of Health:  Denies tobacco, illicit drug use   Test / Admission - Considered:  COVID, diarrhea Vitals signs significant for mild hypertension of 142/80. Otherwise within normal range and stable throughout visit. Laboratory studies significant for: See above Patient with symptoms most consistent with COVID infection with concurrent diarrhea.  Patient without abdominal tenderness with mild transaminitis most likely reactive to patient's  current GI illness given lack of abdominal tenderness in the right upper quadrant.  Patient generally without abdominal tenderness without repeat episodes of loose stools after giving Imodium.  Low suspicions for C. difficile given no recent antibiotic usage.  Given 3 days of diarrhea without fever, abdominal pain, hematochezia/melena, will hold from empiric treatment with antibiotics at this time in favor of symptomatic therapy at home.  Patient will be recommended Imodium as needed for persistent diarrhea as well as electrolyte rich fluid supplementation and bland diet.  Regarding patient's current COVID infection, will prescribe antiviral in the form of Paxlovid at patient's request.  Recommend further symptomatic therapy at home with oral antihistamine, nasal steroid spray as well as cough suppressant to use as needed.  Patient recommended follow-up with primary care for reassessment of symptoms.  Patient overall well-appearing, afebrile in no acute distress.  Treatment plan discussed at length with patient and he acknowledged understanding was agreeable to said plan. Worrisome signs and symptoms were discussed with the patient, and the patient acknowledged understanding to return to the ED if noticed. Patient was stable upon discharge.        Final Clinical Impression(s) / ED Diagnoses Final diagnoses:  COVID  Diarrhea, unspecified type    Rx / DC Orders ED Discharge Orders          Ordered    nirmatrelvir & ritonavir (PAXLOVID, 300/100,) 20 x 150 MG & 10 x 100MG  TBPK  2 times daily        11/12/22 1154    dicyclomine (BENTYL) 20 MG tablet  2 times daily PRN        11/12/22 1154              Peter Garter, [place] 11/12/22 1155    Blane Ohara, MD 11/14/22 2259

## 2022-11-12 NOTE — ED Notes (Signed)
Attempt IV placement in left forearm x2 without success

## 2022-11-12 NOTE — Discharge Instructions (Addendum)
Note the workup today was overall consistent with positive for COVID.  As discussed, we will prescribe antiviral in the form of Paxlovid to take for the next 5 days.  Further symptomatic therapy for upper respiratory illness include oral allergy medicine in the form of Zyrtec/Allegra/Claritin, nasal steroid spray in the form of Nasacort/Flonase as well as Tylenol/Motrin as needed for pain/fever.  Will prescribe Bentyl to take for abdominal spasms.  Recommend bland diet as attached to relieve abdominal discomfort.  You may pick up over-the-counter Imodium to take as needed for persistent diarrhea.  Recommend follow-up with primary care for reassessment of your symptoms.  Please not hesitate to return to emergency department for worrisome signs and symptoms we discussed become apparent.

## 2023-08-09 ENCOUNTER — Emergency Department (HOSPITAL_BASED_OUTPATIENT_CLINIC_OR_DEPARTMENT_OTHER)
Admission: EM | Admit: 2023-08-09 | Discharge: 2023-08-09 | Disposition: A | Discharge status: Home / Self Care | Payer: 59 | Attending: Emergency Medicine | Admitting: Emergency Medicine

## 2023-08-09 ENCOUNTER — Emergency Department (HOSPITAL_BASED_OUTPATIENT_CLINIC_OR_DEPARTMENT_OTHER): Payer: 59

## 2023-08-09 ENCOUNTER — Encounter (HOSPITAL_BASED_OUTPATIENT_CLINIC_OR_DEPARTMENT_OTHER): Payer: Self-pay | Admitting: Emergency Medicine

## 2023-08-09 ENCOUNTER — Other Ambulatory Visit: Payer: Self-pay

## 2023-08-09 DIAGNOSIS — Z20822 Contact with and (suspected) exposure to covid-19: Secondary | ICD-10-CM | POA: Insufficient documentation

## 2023-08-09 DIAGNOSIS — R0602 Shortness of breath: Secondary | ICD-10-CM

## 2023-08-09 DIAGNOSIS — R0989 Other specified symptoms and signs involving the circulatory and respiratory systems: Secondary | ICD-10-CM | POA: Diagnosis not present

## 2023-08-09 DIAGNOSIS — J069 Acute upper respiratory infection, unspecified: Secondary | ICD-10-CM

## 2023-08-09 DIAGNOSIS — E119 Type 2 diabetes mellitus without complications: Secondary | ICD-10-CM | POA: Insufficient documentation

## 2023-08-09 DIAGNOSIS — R42 Dizziness and giddiness: Secondary | ICD-10-CM | POA: Diagnosis not present

## 2023-08-09 LAB — RESP PANEL BY RT-PCR (RSV, FLU A&B, COVID)  RVPGX2
Influenza A by PCR: NEGATIVE
Influenza B by PCR: NEGATIVE
Resp Syncytial Virus by PCR: NEGATIVE
SARS Coronavirus 2 by RT PCR: NEGATIVE

## 2023-08-09 MED ORDER — AEROCHAMBER PLUS FLO-VU SMALL MISC
1.0000 | Freq: Once | Status: AC
  Administered 2023-08-09: 1
  Filled 2023-08-09: qty 1

## 2023-08-09 MED ORDER — ALBUTEROL SULFATE HFA 108 (90 BASE) MCG/ACT IN AERS
1.0000 | INHALATION_SPRAY | Freq: Once | RESPIRATORY_TRACT | Status: AC
  Administered 2023-08-09: 1 via RESPIRATORY_TRACT
  Filled 2023-08-09: qty 6.7

## 2023-08-09 NOTE — ED Notes (Signed)
RT Note: Patient educated on the use of an Albuterol inhaler with the spacer

## 2023-08-09 NOTE — ED Provider Notes (Signed)
[place] [facility] AT [facility] Provider Note   CSN: [id number] Arrival date & time: 08/09/23  1203     History  Chief Complaint  Patient presents with   URI    Anthony Cox is a 32 y.o. male with medical history of diabetes, GERD, headaches.  Patient presents to ED for evaluation of dizziness, shortness of breath and URI symptoms.  States that URI symptoms began on Saturday.  Reports he is experiencing sore throat, cough, body aches and chills.  Denies nausea, vomiting or abdominal pain.  Reports that on Sunday developed shortness of breath, dizziness on standing.  Denies any chest pain.  Denies any medications prior to arrival.  Denies any known sick contacts.   URI Presenting symptoms: congestion, cough and sore throat   Associated symptoms: myalgias        Home Medications Prior to Admission medications   Medication Sig Start Date End Date Taking? Authorizing Provider  dicyclomine (BENTYL) 20 MG tablet Take 1 tablet (20 mg total) by mouth 2 (two) times daily as needed for spasms. 11/12/22   Sherian Maroon A, PA  ondansetron (ZOFRAN) 4 MG tablet Take 1 tablet (4 mg total) by mouth every 6 (six) hours. 08/04/22   Rondel Baton, MD  oseltamivir (TAMIFLU) 75 MG capsule Take 1 capsule (75 mg total) by mouth every 12 (twelve) hours. 08/04/22   Rondel Baton, MD      Allergies    Patient has no known allergies.    Review of Systems   Review of Systems  Constitutional:  Positive for chills.  HENT:  Positive for congestion and sore throat.   Respiratory:  Positive for cough and shortness of breath.   Cardiovascular:  Negative for chest pain.  Musculoskeletal:  Positive for myalgias.  All other systems reviewed and are negative.   Physical Exam Updated Vital Signs BP (!) 171/101   Pulse 98   Temp 98.6 F (37 C) (Oral)   Resp 18   Ht 5\' 8"  (1.727 m)   Wt (!) 200.9 kg   SpO2 100%   BMI 67.36 kg/m  Physical Exam Vitals and  nursing note reviewed.  Constitutional:      General: He is not in acute distress.    Appearance: Normal appearance. He is normal weight. He is not ill-appearing, toxic-appearing or diaphoretic.  HENT:     Head: Normocephalic and atraumatic.     Nose: Nose normal.     Mouth/Throat:     Mouth: Mucous membranes are moist.     Pharynx: Oropharynx is clear. No oropharyngeal exudate or posterior oropharyngeal erythema.  Eyes:     Extraocular Movements: Extraocular movements intact.     Conjunctiva/sclera: Conjunctivae normal.     Pupils: Pupils are equal, round, and reactive to light.  Cardiovascular:     Rate and Rhythm: Normal rate and regular rhythm.  Pulmonary:     Effort: Pulmonary effort is normal.     Breath sounds: Normal breath sounds. No wheezing.  Abdominal:     General: Abdomen is flat. Bowel sounds are normal.     Palpations: Abdomen is soft.     Tenderness: There is no abdominal tenderness.  Musculoskeletal:     Cervical back: Normal range of motion and neck supple. No tenderness.     Right lower leg: No edema.     Left lower leg: No edema.  Skin:    Capillary Refill: Capillary refill takes less than 2 seconds.  Neurological:  General: No focal deficit present.     Mental Status: He is alert and oriented to person, place, and time.     GCS: GCS eye subscore is 4. GCS verbal subscore is 5. GCS motor subscore is 6.     Cranial Nerves: Cranial nerves 2-12 are intact. No cranial nerve deficit.     Sensory: Sensation is intact. No sensory deficit.     Motor: Motor function is intact. No weakness.     ED Results / Procedures / Treatments   Labs (all labs ordered are listed, but only abnormal results are displayed) Labs Reviewed  RESP PANEL BY RT-PCR (RSV, FLU A&B, COVID)  RVPGX2    EKG None  Radiology DG Chest 2 View Result Date: 08/09/2023 CLINICAL DATA:  Shortness of breath and dizziness. Congestion and fatigue. EXAM: CHEST - 2 VIEW COMPARISON:  02/21/2015  FINDINGS: Patient is rotated towards the right. Heart size and mediastinal contours are unremarkable. No signs of pleural effusion, interstitial edema or airspace disease. The visualized osseous structures appear intact. IMPRESSION: No acute cardiopulmonary disease. Electronically Signed   By: Signa Kell M.D.   On: 08/09/2023 15:13    Procedures Procedures   Medications Ordered in ED Medications  albuterol (VENTOLIN HFA) 108 (90 Base) MCG/ACT inhaler 1 puff (1 puff Inhalation Given 08/09/23 1342)  AeroChamber Plus Flo-Vu Small device MISC 1 each (1 each Other Given 08/09/23 1344)    ED Course/ Medical Decision Making/ A&P   Medical Decision Making Amount and/or Complexity of Data Reviewed Radiology: ordered.  Risk Prescription drug management.   32 year old male presents for URI symptoms as well as shortness of breath.  Please see HPI for further details.  On examination patient is afebrile and nontachycardic.  His lung sounds are clear bilaterally, he is nonhypoxic on room air.  Abdomen is soft and compressible throughout.  Neurological examination is at baseline.  No edema to bilateral lower extremities.  Posterior oropharynx has no erythema or exudate.  Uvula is midline.  Handling secretions appropriately without drooling or change in phonation.  Viral panel negative for all.  Chest x-ray unremarkable without consolidations or effusions.  Patient was likely suffering effects of URI.  Have advised patient to treat symptoms conservatively at home.  Have provided him with albuterol inhaler for shortness of breath.  Will have him follow-up with his PCP.  Encouraged to return to ED with any new or worsening symptoms.   Final Clinical Impression(s) / ED Diagnoses Final diagnoses:  Upper respiratory tract infection, unspecified type  Shortness of breath    Rx / DC Orders ED Discharge Orders     None         Al Decant, PA-C 08/09/23 1519    Benjiman Core, MD 08/10/23 1610

## 2023-08-09 NOTE — Discharge Instructions (Signed)
Please follow-up with your PCP regarding your shortness of breath.  Please return to the ED with any new or worsening signs or symptoms.  Your viral testing was negative here however you still probably have a virus such as a common cold.  Please take Tylenol, DayQuil, ibuprofen or Tylenol for symptomatic relief.  Please take Zicam to help shorten duration of symptoms.  Utilize albuterol inhaler for shortness of breath every 4 hours as needed.

## 2023-08-09 NOTE — ED Triage Notes (Signed)
Runny nose, congestion, fatigue, some dizziness since saturday.  No known fever.

## 2023-12-28 DIAGNOSIS — M92503 Unspecified juvenile osteochondrosis, bilateral leg: Secondary | ICD-10-CM | POA: Diagnosis not present

## 2023-12-28 DIAGNOSIS — E1169 Type 2 diabetes mellitus with other specified complication: Secondary | ICD-10-CM | POA: Diagnosis not present

## 2023-12-28 DIAGNOSIS — Z9884 Bariatric surgery status: Secondary | ICD-10-CM | POA: Diagnosis not present

## 2023-12-28 DIAGNOSIS — E7800 Pure hypercholesterolemia, unspecified: Secondary | ICD-10-CM | POA: Diagnosis not present

## 2023-12-28 DIAGNOSIS — R600 Localized edema: Secondary | ICD-10-CM | POA: Diagnosis not present

## 2023-12-28 DIAGNOSIS — G4733 Obstructive sleep apnea (adult) (pediatric): Secondary | ICD-10-CM | POA: Diagnosis not present

## 2024-02-16 DIAGNOSIS — E7800 Pure hypercholesterolemia, unspecified: Secondary | ICD-10-CM | POA: Diagnosis not present

## 2024-02-16 DIAGNOSIS — E782 Mixed hyperlipidemia: Secondary | ICD-10-CM | POA: Diagnosis not present

## 2024-03-27 ENCOUNTER — Emergency Department (HOSPITAL_BASED_OUTPATIENT_CLINIC_OR_DEPARTMENT_OTHER)

## 2024-03-27 ENCOUNTER — Emergency Department (HOSPITAL_BASED_OUTPATIENT_CLINIC_OR_DEPARTMENT_OTHER): Admission: EM | Admit: 2024-03-27 | Discharge: 2024-03-27 | Disposition: A | Discharge status: Home / Self Care

## 2024-03-27 ENCOUNTER — Other Ambulatory Visit: Payer: Self-pay

## 2024-03-27 ENCOUNTER — Encounter (HOSPITAL_BASED_OUTPATIENT_CLINIC_OR_DEPARTMENT_OTHER): Payer: Self-pay | Admitting: Emergency Medicine

## 2024-03-27 DIAGNOSIS — R600 Localized edema: Secondary | ICD-10-CM | POA: Diagnosis not present

## 2024-03-27 DIAGNOSIS — R609 Edema, unspecified: Secondary | ICD-10-CM

## 2024-03-27 DIAGNOSIS — M25472 Effusion, left ankle: Secondary | ICD-10-CM | POA: Insufficient documentation

## 2024-03-27 DIAGNOSIS — M25572 Pain in left ankle and joints of left foot: Secondary | ICD-10-CM | POA: Diagnosis not present

## 2024-03-27 DIAGNOSIS — M7989 Other specified soft tissue disorders: Secondary | ICD-10-CM | POA: Insufficient documentation

## 2024-03-27 DIAGNOSIS — R2242 Localized swelling, mass and lump, left lower limb: Secondary | ICD-10-CM | POA: Diagnosis not present

## 2024-03-27 LAB — PRO BRAIN NATRIURETIC PEPTIDE: Pro Brain Natriuretic Peptide: <36 pg/mL (ref <300.0)

## 2024-03-27 LAB — CBC
HCT: 42.9 % (ref 39.0–52.0)
Hemoglobin: 14 g/dL (ref 13.0–17.0)
MCH: 26.3 pg (ref 26.0–34.0)
MCHC: 32.6 g/dL (ref 30.0–36.0)
MCV: 80.6 fL (ref 80.0–100.0)
Platelets: 217 K/uL (ref 150–400)
RBC: 5.32 MIL/uL (ref 4.22–5.81)
RDW: 15.1 % (ref 11.5–15.5)
WBC: 4.7 K/uL (ref 4.0–10.5)
nRBC: 0 % (ref 0.0–0.2)

## 2024-03-27 LAB — BASIC METABOLIC PANEL WITH GFR
Anion gap: 11 (ref 5–15)
BUN: 9 mg/dL (ref 6–20)
CO2: 26 mmol/L (ref 22–32)
Calcium: 9 mg/dL (ref 8.9–10.3)
Chloride: 102 mmol/L (ref 98–111)
Creatinine, Ser: 0.74 mg/dL (ref 0.61–1.24)
GFR, Estimated: >60 mL/min (ref >60)
Glucose, Bld: 130 mg/dL — ABNORMAL HIGH (ref 70–99)
Potassium: 4.2 mmol/L (ref 3.5–5.1)
Sodium: 139 mmol/L (ref 135–145)

## 2024-03-27 MED ORDER — OXYCODONE-ACETAMINOPHEN 5-325 MG PO TABS
1.0000 | ORAL_TABLET | Freq: Once | ORAL | Status: AC
  Administered 2024-03-27: 1 via ORAL
  Filled 2024-03-27: qty 1

## 2024-03-27 NOTE — ED Notes (Signed)
 Pt d/c home with visitor per EDP order. Discharge summary reviewed, pt verbalizes understanding. NAD.

## 2024-03-27 NOTE — Discharge Instructions (Signed)
 I would take your home furosemide  for fluid, and you can wear compression socks/stockings to help with the swelling.  You can take ibuprofen , Tylenol  as needed for pain.  Please follow-up closely with your primary care doctor, return to the emergency department you have significant worsening swelling despite treatment.

## 2024-03-27 NOTE — ED Provider Notes (Signed)
 [facility] Provider Note   CSN: [id number] Arrival date & time: 03/27/24  9096     Patient presents with: Joint Swelling   Burrel Legrand is a 33 y.o. male with past medical history significant for morbid obesity, obstructive sleep apnea, diabetes, GERD, some chronic lower leg edema who presents concern for acute left ankle swelling, pain since around Saturday.  Worse since yesterday.  No injury noted.  Nothing for pain prior to arrival.   HPI     Prior to Admission medications   Medication Sig Start Date End Date Taking? Authorizing Provider  dicyclomine  (BENTYL ) 20 MG tablet Take 1 tablet (20 mg total) by mouth 2 (two) times daily as needed for spasms. 11/12/22   Silver Wonda LABOR, PA  ondansetron  (ZOFRAN ) 4 MG tablet Take 1 tablet (4 mg total) by mouth every 6 (six) hours. 08/04/22   Yolande Lamar BROCKS, MD  oseltamivir  (TAMIFLU ) 75 MG capsule Take 1 capsule (75 mg total) by mouth every 12 (twelve) hours. 08/04/22   Yolande Lamar BROCKS, MD    Allergies: Patient has no known allergies.    Review of Systems  All other systems reviewed and are negative.   Updated Vital Signs BP (!) 146/102 (BP Location: Right Arm)   Pulse 79   Temp 98.8 F (37.1 C) (Oral)   Resp 16   Ht 5' 8 (1.727 m)   Wt (!) 200.9 kg   SpO2 96%   BMI 67.34 kg/m   Physical Exam Vitals and nursing note reviewed.  Constitutional:      General: He is not in acute distress.    Appearance: Normal appearance.  HENT:     Head: Normocephalic and atraumatic.  Eyes:     General:        Right eye: No discharge.        Left eye: No discharge.  Cardiovascular:     Rate and Rhythm: Normal rate and regular rhythm.     Pulses: Normal pulses.     Heart sounds: No murmur heard.    No friction rub. No gallop.     Comments: DP, PT pulses are 2+ in the affected left lower extremity Pulmonary:     Effort: Pulmonary effort is normal.     Breath sounds: Normal breath  sounds.  Abdominal:     General: Bowel sounds are normal.     Palpations: Abdomen is soft.  Musculoskeletal:     Comments: Left greater than right soft tissue swelling without pitting.  No redness, induration, tenderness to palpation.  Skin:    General: Skin is warm and dry.     Capillary Refill: Capillary refill takes less than 2 seconds.  Neurological:     Mental Status: He is alert and oriented to person, place, and time.  Psychiatric:        Mood and Affect: Mood normal.        Behavior: Behavior normal.     (all labs ordered are listed, but only abnormal results are displayed) Labs Reviewed  BASIC METABOLIC PANEL WITH GFR - Abnormal; Notable for the following components:      Result Value   Glucose, Bld 130 (*)    All other components within normal limits  CBC  PRO BRAIN NATRIURETIC PEPTIDE    EKG: None  Radiology: US  Venous Img Lower  Left (DVT Study) Result Date: 03/27/2024 CLINICAL DATA:  Left ankle pain and swelling. EXAM: LEFT LOWER EXTREMITY VENOUS DOPPLER ULTRASOUND  TECHNIQUE: Gray-scale sonography with compression, as well as color and duplex ultrasound, were performed to evaluate the deep venous system(s) from the level of the common femoral vein through the popliteal and proximal calf veins. COMPARISON:  None Available. FINDINGS: Patient would not remove shorts/clothing. Neither common femoral vein could be assessed. VENOUS Normal compressibility of the superficial femoral, and popliteal veins, as well as the visualized calf veins. Visualized portions of profunda femoral vein and great saphenous vein unremarkable. No filling defects to suggest DVT on grayscale or color Doppler imaging. Doppler waveforms show normal direction of venous flow, normal respiratory plasticity and response to augmentation. Limited views of the contralateral common femoral vein are unremarkable. OTHER None. Limitations: none IMPRESSION: 1. Neither common femoral vein could be assessed as  patient was unwilling to remove pants/clothing. Otherwise, no evidence for DVT in the left lower extremity. Electronically Signed   By: Camellia Candle M.D.   On: 03/27/2024 11:42   DG Ankle Complete Left Result Date: 03/27/2024 CLINICAL DATA:  Left leg swelling since yesterday, worse this morning. No injury. EXAM: LEFT ANKLE COMPLETE - 3+ VIEW COMPARISON:  None Available. FINDINGS: No acute fracture or bone lesion. Old healed fracture of the distal fibular shaft. Ankle joint normally spaced and aligned.  No arthropathic changes. Diffuse subcutaneous soft tissue calcifications. IMPRESSION: 1. No acute fracture or bone lesion.  No ankle joint abnormality. 2. Subcutaneous soft tissue calcifications of unclear etiology. Electronically Signed   By: Alm Parkins M.D.   On: 03/27/2024 10:10     Procedures   Medications Ordered in the ED  oxyCODONE -acetaminophen  (PERCOCET/ROXICET) 5-325 MG per tablet 1 tablet (1 tablet Oral Given 03/27/24 1003)    Clinical Course as of 03/27/24 1154  Mon Mar 27, 2024  1147 US  Venous Img Lower  Left (DVT Study) [IB]    Clinical Course User Index [IB] Bisci, Wilder Seashore                                 Medical Decision Making Amount and/or Complexity of Data Reviewed Labs: ordered. Radiology: ordered.  Risk Prescription drug management.   This patient is a 33 y.o. male who presents to the ED for concern of leg pain, swelling.   Differential diagnoses prior to evaluation: DVT, peripheral edema, Achilles tendon rupture, calf muscle strain, calf muscle rupture, cellulitis, abscess, PAD, PVD, versus other musculoskeletal injury  Past Medical History / Social History / Additional history: Chart reviewed. Pertinent results include: Reports previous fractures of the affected left lower extremity, morbid obesity, dependent edema  Physical Exam: Physical exam performed. The pertinent findings include: DP, PT pulses are 2+ in the affected left lower  extremity  CBC unremarkable, BMP unremarkable, BNP is normal.  Vascular ultrasound of the left lower extremity with no DVT.  Patient did not consent to examination of his common femoral veins, but overall low clinical suspicion for higher location of DVT given no downstream clotting noted.  Left ankle x-ray with prior fracture of fibula noted, some soft tissue calcification of nonspecific etiology.  Medications / Treatment: Encouraged him to take his home Lasix , encouraged compression stockings, suspect decreased vascular flow to the left lower extremity compared to the right secondary to multiple previous fractures of the affected leg.   Disposition: After consideration of the diagnostic results and the patients response to treatment, I feel that patient is stable for discharge with plan as above.   emergency department  workup does not suggest an emergent condition requiring admission or immediate intervention beyond what has been performed at this time. The plan is: as above. The patient is safe for discharge and has been instructed to return immediately for worsening symptoms, change in symptoms or any other concerns.   Final diagnoses:  Dependent edema  Leg swelling    ED Discharge Orders     None          Rosan Sherlean VEAR DEVONNA 03/27/24 1154    Ula Prentice SAUNDERS, MD 03/27/24 1441

## 2024-03-27 NOTE — ED Triage Notes (Addendum)
 Pain inleft ankle since sat and swelling since yesterday worse this am no injury

## 2024-05-10 DIAGNOSIS — M1712 Unilateral primary osteoarthritis, left knee: Secondary | ICD-10-CM | POA: Diagnosis not present

## 2024-05-10 DIAGNOSIS — M1711 Unilateral primary osteoarthritis, right knee: Secondary | ICD-10-CM | POA: Diagnosis not present

## 2024-05-10 DIAGNOSIS — M170 Bilateral primary osteoarthritis of knee: Secondary | ICD-10-CM | POA: Diagnosis not present

## 2024-09-12 ENCOUNTER — Other Ambulatory Visit: Payer: Self-pay

## 2024-09-12 ENCOUNTER — Encounter (HOSPITAL_BASED_OUTPATIENT_CLINIC_OR_DEPARTMENT_OTHER): Payer: Self-pay | Admitting: Emergency Medicine

## 2024-09-12 ENCOUNTER — Emergency Department (HOSPITAL_BASED_OUTPATIENT_CLINIC_OR_DEPARTMENT_OTHER): Admission: EM | Admit: 2024-09-12 | Discharge: 2024-09-12 | Disposition: A | Discharge status: Home / Self Care

## 2024-09-12 DIAGNOSIS — J101 Influenza due to other identified influenza virus with other respiratory manifestations: Secondary | ICD-10-CM | POA: Insufficient documentation

## 2024-09-12 LAB — RESP PANEL BY RT-PCR (RSV, FLU A&B, COVID)  RVPGX2
Influenza A by PCR: NEGATIVE
Influenza B by PCR: POSITIVE — AB
Resp Syncytial Virus by PCR: NEGATIVE
SARS Coronavirus 2 by RT PCR: NEGATIVE

## 2024-09-12 MED ORDER — CETIRIZINE HCL 10 MG PO TABS: 10.0000 mg | ORAL_TABLET | Freq: Every day | ORAL | 0 refills | Status: AC

## 2024-09-12 MED ORDER — TRIAMCINOLONE ACETONIDE 55 MCG/ACT NA AERO: 2.0000 | INHALATION_SPRAY | Freq: Every day | NASAL | 12 refills | Status: AC

## 2024-09-12 MED ORDER — IBUPROFEN 600 MG PO TABS: 600.0000 mg | ORAL_TABLET | Freq: Four times a day (QID) | ORAL | 0 refills | Status: AC | PRN

## 2024-09-12 NOTE — ED Triage Notes (Signed)
 Pt c/o congestion, rhinorrhea, cough, fatigue x 1 week.   Good po intake.

## 2024-09-12 NOTE — Discharge Instructions (Addendum)
 Your test came back positive for influenza --after we talked.  Viral Illness TREATMENT  Treatment is directed at relieving symptoms. There is no cure. Antibiotics are not effective, because the infection is caused by a virus, not by bacteria. Treatment may include:  Increased fluid intake. Sports drinks offer valuable electrolytes, sugars, and fluids.  Breathing heated mist or steam (vaporizer or shower).  Eating chicken soup or other clear broths, and maintaining good nutrition.  Getting plenty of rest.  Using gargles or lozenges for comfort.  Increasing usage of your inhaler if you have asthma.  Return to work when your temperature has returned to normal.  Gargle warm salt water and spit it out for sore throat. Take benadryl  to decrease sinus secretions. Continue to alternate between Tylenol  and ibuprofen  for pain and fever control.  Follow Up: Follow up with your primary care doctor in 5-7 days for recheck of ongoing symptoms.  Return to emergency department for emergent changing or worsening of symptoms.
